# Patient Record
Sex: Male | Born: 1970 | Race: Black or African American | Hispanic: No | Marital: Single | Smoking: Never smoker
Health system: Southern US, Community
[De-identification: ages and names within clinical notes are randomized; demographics above are authoritative.]

## PROBLEM LIST (undated history)

## (undated) DIAGNOSIS — G893 Neoplasm related pain (acute) (chronic): Secondary | ICD-10-CM

## (undated) DIAGNOSIS — I10 Essential (primary) hypertension: Secondary | ICD-10-CM

## (undated) DIAGNOSIS — M5416 Radiculopathy, lumbar region: Secondary | ICD-10-CM

## (undated) DIAGNOSIS — G8929 Other chronic pain: Secondary | ICD-10-CM

## (undated) DIAGNOSIS — M549 Dorsalgia, unspecified: Secondary | ICD-10-CM

## (undated) HISTORY — PX: LEG SURGERY: SHX1003

---

## 2009-09-23 ENCOUNTER — Emergency Department (HOSPITAL_COMMUNITY): Admission: EM | Admit: 2009-09-23 | Discharge: 2009-09-23 | Payer: Self-pay | Admitting: Emergency Medicine

## 2009-10-24 ENCOUNTER — Emergency Department (HOSPITAL_COMMUNITY)
Admission: EM | Admit: 2009-10-24 | Discharge: 2009-10-24 | Payer: Self-pay | Source: Home / Self Care | Admitting: Emergency Medicine

## 2009-10-30 ENCOUNTER — Emergency Department (HOSPITAL_COMMUNITY): Admission: EM | Admit: 2009-10-30 | Discharge: 2009-10-30 | Payer: Self-pay | Admitting: Emergency Medicine

## 2009-11-22 ENCOUNTER — Emergency Department (HOSPITAL_COMMUNITY): Admission: EM | Admit: 2009-11-22 | Discharge: 2009-11-22 | Payer: Self-pay | Admitting: Emergency Medicine

## 2009-11-25 ENCOUNTER — Emergency Department (HOSPITAL_COMMUNITY): Admission: EM | Admit: 2009-11-25 | Discharge: 2009-11-25 | Payer: Self-pay | Admitting: Emergency Medicine

## 2009-12-07 ENCOUNTER — Ambulatory Visit: Payer: Self-pay | Admitting: Internal Medicine

## 2009-12-07 LAB — CONVERTED CEMR LAB
CO2: 28 meq/L (ref 19–32)
Calcium: 10.2 mg/dL (ref 8.4–10.5)
Chloride: 100 meq/L (ref 96–112)
Cholesterol: 157 mg/dL (ref 0–200)
Creatinine, Ser: 1.05 mg/dL (ref 0.40–1.50)
Glucose, Bld: 80 mg/dL (ref 70–99)
Total Bilirubin: 1.2 mg/dL (ref 0.3–1.2)
Total CHOL/HDL Ratio: 2.6
Triglycerides: 73 mg/dL (ref <150)
VLDL: 15 mg/dL (ref 0–40)

## 2009-12-25 ENCOUNTER — Emergency Department (HOSPITAL_COMMUNITY): Admission: EM | Admit: 2009-12-25 | Discharge: 2009-12-25 | Payer: Self-pay | Admitting: Emergency Medicine

## 2010-01-03 ENCOUNTER — Emergency Department (HOSPITAL_COMMUNITY): Admission: EM | Admit: 2010-01-03 | Discharge: 2010-01-03 | Payer: Self-pay | Admitting: Emergency Medicine

## 2010-01-05 ENCOUNTER — Emergency Department (HOSPITAL_COMMUNITY): Admission: EM | Admit: 2010-01-05 | Discharge: 2010-01-05 | Payer: Self-pay | Admitting: Emergency Medicine

## 2010-05-06 ENCOUNTER — Emergency Department (HOSPITAL_COMMUNITY): Payer: Self-pay

## 2010-05-06 ENCOUNTER — Emergency Department (HOSPITAL_COMMUNITY)
Admission: EM | Admit: 2010-05-06 | Discharge: 2010-05-06 | Disposition: A | Discharge status: Home / Self Care | Payer: Self-pay | Attending: Emergency Medicine | Admitting: Emergency Medicine

## 2010-05-06 DIAGNOSIS — S300XXA Contusion of lower back and pelvis, initial encounter: Secondary | ICD-10-CM | POA: Insufficient documentation

## 2010-05-06 DIAGNOSIS — S298XXA Other specified injuries of thorax, initial encounter: Secondary | ICD-10-CM | POA: Insufficient documentation

## 2010-05-06 DIAGNOSIS — R071 Chest pain on breathing: Secondary | ICD-10-CM | POA: Insufficient documentation

## 2010-05-06 DIAGNOSIS — I10 Essential (primary) hypertension: Secondary | ICD-10-CM | POA: Insufficient documentation

## 2010-05-06 DIAGNOSIS — G8929 Other chronic pain: Secondary | ICD-10-CM | POA: Insufficient documentation

## 2010-05-06 DIAGNOSIS — Z79899 Other long term (current) drug therapy: Secondary | ICD-10-CM | POA: Insufficient documentation

## 2010-05-06 DIAGNOSIS — IMO0001 Reserved for inherently not codable concepts without codable children: Secondary | ICD-10-CM | POA: Insufficient documentation

## 2010-05-06 DIAGNOSIS — R0602 Shortness of breath: Secondary | ICD-10-CM | POA: Insufficient documentation

## 2010-05-14 ENCOUNTER — Emergency Department (HOSPITAL_COMMUNITY)
Admission: EM | Admit: 2010-05-14 | Discharge: 2010-05-14 | Disposition: A | Discharge status: Home / Self Care | Payer: Self-pay | Attending: Emergency Medicine | Admitting: Emergency Medicine

## 2010-05-14 DIAGNOSIS — Z79899 Other long term (current) drug therapy: Secondary | ICD-10-CM | POA: Insufficient documentation

## 2010-05-14 DIAGNOSIS — G8929 Other chronic pain: Secondary | ICD-10-CM | POA: Insufficient documentation

## 2010-05-14 DIAGNOSIS — I10 Essential (primary) hypertension: Secondary | ICD-10-CM | POA: Insufficient documentation

## 2010-05-14 DIAGNOSIS — M549 Dorsalgia, unspecified: Secondary | ICD-10-CM | POA: Insufficient documentation

## 2010-05-14 DIAGNOSIS — S298XXA Other specified injuries of thorax, initial encounter: Secondary | ICD-10-CM | POA: Insufficient documentation

## 2010-05-14 DIAGNOSIS — R071 Chest pain on breathing: Secondary | ICD-10-CM | POA: Insufficient documentation

## 2010-05-24 LAB — POCT I-STAT, CHEM 8
Calcium, Ion: 1.07 mmol/L — ABNORMAL LOW (ref 1.12–1.32)
Chloride: 104 mEq/L (ref 96–112)
Creatinine, Ser: 1 mg/dL (ref 0.4–1.5)
Glucose, Bld: 90 mg/dL (ref 70–99)
HCT: 44 % (ref 39.0–52.0)
Hemoglobin: 15 g/dL (ref 13.0–17.0)
Potassium: 3.6 mEq/L (ref 3.5–5.1)

## 2010-05-26 LAB — POCT I-STAT, CHEM 8
BUN: 7 mg/dL (ref 6–23)
Calcium, Ion: 1.15 mmol/L (ref 1.12–1.32)
Chloride: 104 mEq/L (ref 96–112)
Glucose, Bld: 91 mg/dL (ref 70–99)
TCO2: 28 mmol/L (ref 0–100)

## 2010-05-29 ENCOUNTER — Emergency Department (HOSPITAL_COMMUNITY)
Admission: EM | Admit: 2010-05-29 | Discharge: 2010-05-29 | Disposition: A | Discharge status: Home / Self Care | Payer: Self-pay | Attending: Emergency Medicine | Admitting: Emergency Medicine

## 2010-05-29 DIAGNOSIS — I10 Essential (primary) hypertension: Secondary | ICD-10-CM | POA: Insufficient documentation

## 2010-05-29 DIAGNOSIS — M545 Low back pain, unspecified: Secondary | ICD-10-CM | POA: Insufficient documentation

## 2010-05-29 DIAGNOSIS — Z79899 Other long term (current) drug therapy: Secondary | ICD-10-CM | POA: Insufficient documentation

## 2010-05-29 DIAGNOSIS — G8929 Other chronic pain: Secondary | ICD-10-CM | POA: Insufficient documentation

## 2014-02-26 ENCOUNTER — Emergency Department (HOSPITAL_COMMUNITY)
Admission: EM | Admit: 2014-02-26 | Discharge: 2014-02-26 | Disposition: A | Discharge status: Home / Self Care | Payer: Self-pay | Attending: Emergency Medicine | Admitting: Emergency Medicine

## 2014-02-26 ENCOUNTER — Encounter (HOSPITAL_COMMUNITY): Payer: Self-pay | Admitting: Emergency Medicine

## 2014-02-26 DIAGNOSIS — M545 Low back pain, unspecified: Secondary | ICD-10-CM

## 2014-02-26 DIAGNOSIS — Z79899 Other long term (current) drug therapy: Secondary | ICD-10-CM | POA: Insufficient documentation

## 2014-02-26 DIAGNOSIS — M79605 Pain in left leg: Secondary | ICD-10-CM | POA: Insufficient documentation

## 2014-02-26 HISTORY — DX: Neoplasm related pain (acute) (chronic): G89.3

## 2014-02-26 MED ORDER — PREDNISONE 10 MG PO TABS: ORAL_TABLET | ORAL | Status: DC

## 2014-02-26 MED ORDER — OXYCODONE-ACETAMINOPHEN 5-325 MG PO TABS: 2.0000 | ORAL_TABLET | ORAL | Status: DC | PRN

## 2014-02-26 NOTE — ED Provider Notes (Signed)
CSN: [id number]     Arrival date & time 02/26/14  1049 History   First MD Initiated Contact with Patient 02/26/14 1136     Chief Complaint  Patient presents with  . Back Pain  . Leg Pain     (Consider location/radiation/quality/duration/timing/severity/associated sxs/prior Treatment) Patient is a 43 y.o. male presenting with back pain and leg pain. The history is provided by the patient. No language interpreter was used.  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  Pt reports he saw neurosurgeon who advised he did not need surgery. Pt reports one week ago he began having pain in low back  Past Medical History  Diagnosis Date  . Tumor associated pain    History reviewed. No pertinent past surgical history. History reviewed. No pertinent family history. History  Substance Use Topics  . Smoking status: Never Smoker   . Smokeless tobacco: Not on file  . Alcohol Use: No    Review of Systems  Musculoskeletal: Positive for back pain.  All other systems reviewed and are negative.     Allergies  Hydrocodone; Ibuprofen; and Iodine  Home Medications   Prior to Admission medications   Medication Sig Start Date End Date Taking? Authorizing Provider  amLODipine (NORVASC) 10 MG tablet Take 10 mg by mouth daily.   Yes Historical Provider, MD   BP 115/76 mmHg  Pulse 102  Temp(Src) 98.3 F (36.8 C) (Oral)  Resp 18  SpO2 97% Physical Exam  Constitutional: He appears well-developed and well-nourished.   HENT:  Head: Normocephalic.  Right Ear: External ear normal.  Left Ear: External ear normal.  Nose: Nose normal.  Mouth/Throat: Oropharynx is clear and moist.  Eyes: Conjunctivae are normal. Pupils are equal, round, and reactive to light.  Neck: Normal range of motion. Neck supple.  Cardiovascular: Normal rate and normal heart sounds.   Pulmonary/Chest: Effort normal.  Abdominal: Soft.  Musculoskeletal: Normal range of motion.  Neurological: He is alert.  Skin: Skin is warm.  Psychiatric: He has a normal mood and affect.  Nursing note and vitals reviewed.   ED Course  Procedures (including critical care time) Labs Review Labs Reviewed - No data to display  Imaging Review No results found.   EKG Interpretation None      MDM   Final diagnoses:  Midline low back pain without sciatica    Prednisone Percocet Schedule appoinment to see Dr. Rayburn MaBlackmon for evaluation    Elson AreasLeslie K Sofia, PA-C 02/26/14 1209  Lyanne CoKevin M Campos, MD 02/26/14 [phone]

## 2014-02-26 NOTE — ED Notes (Signed)
Pt c/o mid back pain from tumor on spine per pt; pt sts pain radiating down left leg

## 2014-03-05 ENCOUNTER — Encounter (HOSPITAL_COMMUNITY): Payer: Self-pay

## 2014-03-05 ENCOUNTER — Emergency Department (HOSPITAL_COMMUNITY)
Admission: EM | Admit: 2014-03-05 | Discharge: 2014-03-05 | Disposition: A | Discharge status: Home / Self Care | Payer: Medicaid - Out of State | Attending: Emergency Medicine | Admitting: Emergency Medicine

## 2014-03-05 DIAGNOSIS — M5442 Lumbago with sciatica, left side: Secondary | ICD-10-CM | POA: Diagnosis not present

## 2014-03-05 DIAGNOSIS — M25569 Pain in unspecified knee: Secondary | ICD-10-CM | POA: Insufficient documentation

## 2014-03-05 DIAGNOSIS — I10 Essential (primary) hypertension: Secondary | ICD-10-CM | POA: Insufficient documentation

## 2014-03-05 DIAGNOSIS — Z79899 Other long term (current) drug therapy: Secondary | ICD-10-CM | POA: Insufficient documentation

## 2014-03-05 DIAGNOSIS — G8929 Other chronic pain: Secondary | ICD-10-CM | POA: Insufficient documentation

## 2014-03-05 DIAGNOSIS — R102 Pelvic and perineal pain: Secondary | ICD-10-CM | POA: Diagnosis present

## 2014-03-05 HISTORY — DX: Essential (primary) hypertension: I10

## 2014-03-05 MED ORDER — HYDROMORPHONE HCL 1 MG/ML IJ SOLN
1.0000 mg | Freq: Once | INTRAMUSCULAR | Status: AC
  Administered 2014-03-05: 1 mg via INTRAMUSCULAR
  Filled 2014-03-05: qty 1

## 2014-03-05 MED ORDER — OXYCODONE-ACETAMINOPHEN 5-325 MG PO TABS
1.0000 | ORAL_TABLET | ORAL | Status: DC | PRN
Start: 2014-03-05 — End: 2014-04-04

## 2014-03-05 NOTE — Discharge Instructions (Signed)
Back Exercises Back exercises help treat and prevent back injuries. The goal of back exercises is to increase the strength of your abdominal and back muscles and the flexibility of your back. These exercises should be started when you no longer have back pain. Back exercises include:  Pelvic Tilt. Lie on your back with your knees bent. Tilt your pelvis until the lower part of your back is against the floor. Hold this position 5 to 10 sec and repeat 5 to 10 times.  Knee to Chest. Pull first 1 knee up against your chest and hold for 20 to 30 seconds, repeat this with the other knee, and then both knees. This may be done with the other leg straight or bent, whichever feels better.  Sit-Ups or Curl-Ups. Bend your knees 90 degrees. Start with tilting your pelvis, and do a partial, slow sit-up, lifting your trunk only 30 to 45 degrees off the floor. Take at least 2 to 3 seconds for each sit-up. Do not do sit-ups with your knees out straight. If partial sit-ups are difficult, simply do the above but with only tightening your abdominal muscles and holding it as directed.  Hip-Lift. Lie on your back with your knees flexed 90 degrees. Push down with your feet and shoulders as you raise your hips a couple inches off the floor; hold for 10 seconds, repeat 5 to 10 times.  Back arches. Lie on your stomach, propping yourself up on bent elbows. Slowly press on your hands, causing an arch in your low back. Repeat 3 to 5 times. Any initial stiffness and discomfort should lessen with repetition over time.  Shoulder-Lifts. Lie face down with arms beside your body. Keep hips and torso pressed to floor as you slowly lift your head and shoulders off the floor. Do not overdo your exercises, especially in the beginning. Exercises may cause you some mild back discomfort which lasts for a few minutes; however, if the pain is more severe, or lasts for more than 15 minutes, do not continue exercises until you see your caregiver.  Improvement with exercise therapy for back problems is slow.  See your caregivers for assistance with developing a proper back exercise program. Document Released: 04/05/2004 Document Revised: 05/21/2011 Document Reviewed: 12/28/2010 [facility] Patient Information 2015 [place]. This information is not intended to replace advice given to you by your health care provider. Make sure you discuss any questions you have with your health care provider. Radicular Pain Radicular pain in either the arm or leg is usually from a bulging or herniated disk in the spine. A piece of the herniated disk may press against the nerves as the nerves exit the spine. This causes pain which is felt at the tips of the nerves down the arm or leg. Other causes of radicular pain may include:  Fractures.  Heart disease.  Cancer.  An abnormal and usually degenerative state of the nervous system or nerves (neuropathy). Diagnosis may require CT or MRI scanning to determine the primary cause.  Nerves that start at the neck (nerve roots) may cause radicular pain in the outer shoulder and arm. It can spread down to the thumb and fingers. The symptoms vary depending on which nerve root has been affected. In most cases radicular pain improves with conservative treatment. Neck problems may require physical therapy, a neck collar, or cervical traction. Treatment may take many weeks, and surgery may be considered if the symptoms do not improve.  Conservative treatment is also recommended for sciatica. Sciatica  causes pain to radiate from the lower back or buttock area down the leg into the foot. Often there is a history of back problems. Most patients with sciatica are better after 2 to 4 weeks of rest and other supportive care. Short term bed rest can reduce the disk pressure considerably. Sitting, however, is not a good position since this increases the pressure on the disk. You should avoid bending, lifting, and all other  activities which make the problem worse. Traction can be used in severe cases. Surgery is usually reserved for patients who do not improve within the first months of treatment. Only take over-the-counter or prescription medicines for pain, discomfort, or fever as directed by your caregiver. Narcotics and muscle relaxants may help by relieving more severe pain and spasm and by providing mild sedation. Cold or massage can give significant relief. Spinal manipulation is not recommended. It can increase the degree of disc protrusion. Epidural steroid injections are often effective treatment for radicular pain. These injections deliver medicine to the spinal nerve in the space between the protective covering of the spinal cord and back bones (vertebrae). Your caregiver can give you more information about steroid injections. These injections are most effective when given within two weeks of the onset of pain.  You should see your caregiver for follow up care as recommended. A program for neck and back injury rehabilitation with stretching and strengthening exercises is an important part of management.  SEEK IMMEDIATE MEDICAL CARE IF:  You develop increased pain, weakness, or numbness in your arm or leg.  You develop difficulty with bladder or bowel control.  You develop abdominal pain. Document Released: 04/05/2004 Document Revised: 05/21/2011 Document Reviewed: 06/21/2008 [facility] Patient Information 2015 [place], [place]. This information is not intended to replace advice given to you by your health care provider. Make sure you discuss any questions you have with your health care provider.   Emergency Department Resource Guide 1) Find a Doctor and Pay Out of Pocket Although you won't have to find out who is covered by your insurance plan, it is a good idea to ask around and get recommendations. You will then need to call the office and see if the doctor you have chosen will accept you as a new patient and  what types of options they offer for patients who are self-pay. Some doctors offer discounts or will set up payment plans for their patients who do not have insurance, but you will need to ask so you aren't surprised when you get to your appointment.  2) Contact Your Local Health Department Not all health departments have doctors that can see patients for sick visits, but many do, so it is worth a call to see if yours does. If you don't know where your local health department is, you can check in your phone book. The CDC also has a tool to help you locate your state's health department, and many state websites also have listings of all of their local health departments.  3) Find a Walk-in Clinic If your illness is not likely to be very severe or complicated, you may want to try a walk in clinic. These are popping up all over the country in pharmacies, drugstores, and shopping centers. They're usually staffed by nurse practitioners or physician assistants that have been trained to treat common illnesses and complaints. They're usually fairly quick and inexpensive. However, if you have serious medical issues or chronic medical problems, these are probably not your best option.  No  Primary Care Doctor: - Call Health Connect at  [phone] - they can help you locate a primary care doctor that  accepts your insurance, provides certain services, etc. - Physician Referral Service- [phone]  Chronic Pain Problems: Organization         Address  Phone   Notes  [facility]  [phone] Patients need to be referred by their primary care doctor.   Medication Assistance: Organization         Address  Phone   Notes  [facility] Medication [facility] [address] [place], [place] [address] [phone]) [phone] --Must be a resident of [facility] [place] -- Must have NO insurance coverage whatsoever (no Medicaid/ Medicare, etc.) -- The pt. MUST have a primary care doctor  that directs their care regularly and follows them in the community   MedAssist  [phone]   [employer]  [phone]) [phone]    Agencies that provide inexpensive medical care: Organization         Address  Phone   Notes  [facility]  [phone](336) [phone]   [facility]    [phone]) [phone]   [facility] [address] [place], [place] [address] [phone]) [phone]   [facility] [address], [place] [phone](336) [phone]   [facility]anned [facility]    [phone](336) [phone]   [facility]    [phone]) [phone]   Community [facility] and [facility]  [address], [place] Phone:  [phone], Fax:  [phone] Hours of Operation:  9 am - 6 pm, M-F.  Also accepts Medicaid/Medicare and self-pay.  [facility]  [address], [place] Phone: [phone](336) [phone], Fax: [phone]) [phone]. Hours of Operation:  8:30 am - 5:30 pm, M-F.  Also accepts Medicaid and self-pay.  [facility] [place] [address], [place] Phone: [phone]) [phone]   [facility] [address], [place], [place] [phone], Ext. [phone] Mondays & Thursdays: 7-9 AM.  First 15 patients are seen on a first come, first serve basis.    Medicaid-accepting [facility] Providers:  Organization         Address  Phone   Notes  [facility] [address], [place] [phone] Also accepts self-pay patients.  [facility] [address], [place]  [phone]) [phone]   [facility] [address], [place] [phone]) [phone]   [facility] [address] [place] [address], [place] [phone]) [phone]   Renaye RakersVeita Bland [address], [place]   [phone] Only accepts [place] Access [place] patients after they have their name applied to their card.   Self-Pay (no insurance) in [facility] [place]:  Organization          Address  Phone   Notes  Sickle Cell Patients, [facility] [address], [place] [phone]) [phone]   [facility] [address], [place] [phone]) [phone]   [facility] [place]  [address], [place] [phone]) [phone]   [facility]/Dr. Osei-Bonsu  [address] [place] [address], [place] or [phone], [place] [phone]) [phone] Phone number for both [place] and [place] locations is the same.  [facility] [address], [place] [phone]  [facility] [address] [place] or [address] [phone]   [facility] [place], [place] [phone], phone; [phone], fax Sees patients 1st and 3rd Saturday of every month.  Must not qualify for public or private insurance (i.e. Medicaid, Medicare, [place] Health Choice, Veterans' Benefits)  Household income should be no more than 200% of the poverty level The clinic cannot treat you if you are pregnant or think you are pregnant  Sexually transmitted diseases are not treated at the clinic.    Dental Care: Organization         Address  Phone  Notes  [facility] [place] [phone] Accepts children up to age 85 who are enrolled in [place] or [place]; pregnant women with a Medicaid card; and children who have applied for Medicaid or [place] Health Choice, but were declined, whose parents can pay a reduced fee at time of service.  [facility]  [address], [place] [phone] Accepts children up to age 87 who are enrolled in [place] or [place]; pregnant women with a Medicaid card; and children who have applied for Medicaid or  Health Choice, but were declined, whose parents can pay a reduced fee at time of  service.  [facility] Adult [facility] Access PROGRAM  [place] [phone] Patients are seen by appointment only. Walk-ins are not accepted. [employer] will see patients 56 years of age and older. Monday - Tuesday (8am-5pm) Most Wednesdays (8:30-5pm) $30 per visit, cash only  [facility] Access PRO[facility]  [address], [facility] [phone] Patients are seen by appointment only. Walk-ins are not accepted. [place] will see patients 45 years of age and older. One Wednesday Evening (Monthly: Volunteer Based).  $30 per visit, cash only  [place]  [phone] for adults; Children under age 62, call Graduate Pediatric Dentistry at [phone]. Children aged 14-14, please call [phone] to request a pediatric application.  Dental services are provided in all areas of dental care including fillings, crowns and bridges, complete and partial dentures, implants, gum treatment, root canals, and extractions. Preventive care is also provided. Treatment is provided to both adults and children. Patients are selected via a lottery and there is often a waiting list.   [facility] [address] [place]  [phone] [email or web address]   [facility] [address] [place], [place] [phone], Ext. [phone] Second and Fourth Thursday of each month, opens at 6:30 AM; Clinic ends at 9 AM.  Patients are seen on a first-come first-served basis, and a limited number are seen during each clinic.   [facility]  [address] Hillard Danker [place], [place] [phone]   Eligibility Requirements You must have lived in [place], [place], or [place] counties for at least the last three months.   You cannot be eligible for state or federal sponsored [employer], including [employer], [place], or [employer].   You generally cannot be eligible for healthcare insurance through your employer.     How to apply: Eligibility screenings are held every Tuesday and Wednesday afternoon from 1:00 pm until 4:00 pm. You do not need an appointment for the interview!  [facility] [address], [place], [place]  [place]  [place] Department  [place]  [phone]    Behavioral Health Resources in the Community: Intensive Outpatient Programs Organization         Address  Phone  Notes  [place] [place], [place] [phone]   [facility] [address] [place], [place]   ADS: Alcohol & Drug Svcs [address] [place], [place]   [place] [address]  [place], [place] or [phone]   Substance Abuse Resources Organization         Address  Phone  Notes  Alcohol and Drug Services  [phone]   [place]  [phone]   The [place]   Chinita Pester  [phone]   Residential & Outpatient Substance Abuse Program  [phone]   Psychological Services Organization         Address  Phone  Notes  [facility] [place]  [place]  [phone]   [place] [address], [place] or [phone]    Mobile Crisis Teams Organization         Address  Phone  Notes  [facility]rapeutic Alternatives, Mobile Crisis Care Unit  [phone]   [facility]ive Psychotherapeutic Services  [address] [place], [place]   [place] Levels [address] [place] [phone]    Self-Help/Support Groups Organization         Address  Phone             Notes  [place]. of [place] - variety of support groups  [place] Call for more information  Narcotics Anonymous (NA), Caring Services [address], [employer] [place]  2 meetings at this location   Special educational needs teacher         Address  Phone  Notes  [facility] Residential Treatment Ken Caryl,    [place]  [phone]   [facility]  [address], [place] [id number], [place], [place]   Aldine [place], [place] [phone] Admissions: 8am-3pm M-F  Incentives Substance [place] [address]    [place], [place] [phone]   [facility] [address] [place], [place], [place]   [facility] [address]  [place], [place]   Insight Programs - Intensive Outpatient [address] Kristeen Mans 1, [place], [place]   [facility] ([place].,  [place], [place] [phone] or [phone]   [facility]idential Treatment Services (RTS) [address], [place], [place] Accepts Medicaid  [facility] [place] [address],  [place] [phone] Substance Abuse/Addiction Treatment   [facility] Organization         Address  Phone  Notes  [facility]  [phone]   Domenic Schwab, PhD [address] Arlis Porta [place], [place]   [phone] or [phone]   [place], [place] [phone]   [place] [address] [place], [place] [phone] Insurance/Medicaid/sponsorship through [employer] and Families [address] [id number]  [place], [place] [phone] [place], [place] [phone]    Dr. Adele Schilder  [phone]   [facility] [place] [facility] Dept. 1) [address], [place] 2) [place] 3)  [place] [address], [place] [phone]  [phone]   Center [phone] or [phone] (After Hours)

## 2014-03-05 NOTE — ED Notes (Signed)
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 He reports pain in his pelvic region, left hip, and knee. He also reports muscle spasms. Pt ambulatory to room D32.

## 2014-03-05 NOTE — ED Provider Notes (Signed)
CSN: [id number]     Arrival date & time 03/05/14  9562 History   First MD Initiated Contact with Patient 03/05/14 1007     Chief Complaint  Patient presents with  . Pelvic Pain  . Knee Pain   Garrett Cole is a 43 y.o. 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 Patient reports that he has chronic pain in this distribution however today it is flaring up. The patient reports his pain has been worse since last night and he rates his pain at a 10 out of 10. Contrary to the nursing note he has no pain in his pelvic region. The patient denies changes to his type of pain only that it has worsened today. The patient has tried heating pads without relief today. The patient was referred to Dr. Magnus Ivan at his last visit in the ED on 02/26/2014 and he has been unable to get an appointment. The patient has out-of-state insurance and does not have a primary care provider currently. The patient does not regularly take pain medicine for his pain. The patient denies fevers, chills, abdominal pain, nausea, vomiting, numbness, loss of bowel control, loss of bladder control, loss of coordination, headaches, changes to his vision, chest pain, shortness of breath, palpitations. Patient denies history of cancer. The patient denies history of IV drug use.  (Consider location/radiation/quality/duration/timing/severity/associated sxs/prior Treatment) HPI  Past Medical History  Diagnosis Date  . Tumor associated pain   . Hypertension    Past Surgical History  Procedure Laterality Date  . Leg surgery     History reviewed. No pertinent family history. History  Substance Use Topics  . Smoking status: Never Smoker   . Smokeless tobacco: Not on file  . Alcohol Use: No    Review of Systems  Constitutional: Negative for fever and chills.  HENT: Negative for congestion, ear  pain, sore throat and trouble swallowing.   Eyes: Negative for pain and visual disturbance.  Respiratory: Negative for cough, shortness of breath and wheezing.   Cardiovascular: Negative for chest pain, palpitations and leg swelling.  Gastrointestinal: Negative for nausea, vomiting, abdominal pain, diarrhea and blood in stool.  Genitourinary: Negative for dysuria, urgency, frequency and hematuria.  Musculoskeletal: Positive for back pain. Negative for joint swelling, neck pain and neck stiffness.  Skin: Negative for rash and wound.  Neurological: Negative for dizziness, numbness and headaches.  All other systems reviewed and are negative.     Allergies  Hydrocodone; Ibuprofen; and Iodine  Home Medications   Prior to Admission medications   Medication Sig Start Date End Date Taking? Authorizing Provider  amLODipine (NORVASC) 10 MG tablet Take 10 mg by mouth daily.   Yes Historical Provider, MD  oxyCODONE-acetaminophen (PERCOCET/ROXICET) 5-325 MG per tablet Take 1 tablet by mouth every 4 (four) hours as needed for severe pain. May take 2 tablets PO q 6 hours for severe pain - Do not take with Tylenol as this tablet already contains tylenol 03/05/14   Einar Gip Helaman Mecca, PA-C  predniSONE (DELTASONE) 10 MG tablet 6,5,4,3,2,1 taper Patient not taking: Reported on 03/05/2014 02/26/14   Elson Areas, PA-C   BP 122/91 mmHg  Pulse 94  Temp(Src) 98.9 F (37.2 C) (Oral)  Resp 16  Ht 6' (1.829 m)  Wt 160 lb (72.576 kg)  BMI 21.70 kg/m2  SpO2 99% Physical Exam  Constitutional: He is  oriented to person, place, and time. He appears well-developed and well-nourished. No distress.  HENT:  Head: Normocephalic and atraumatic.  Mouth/Throat: Oropharynx is clear and moist. No oropharyngeal exudate.  Eyes: Conjunctivae are normal. Pupils are equal, round, and reactive to light. Right eye exhibits no discharge. Left eye exhibits no discharge.  Neck: Normal range of motion. Neck supple.   Cardiovascular: Normal rate, regular rhythm, normal heart sounds and intact distal pulses.  Exam reveals no gallop and no friction rub.   No murmur heard. HR 88. Bilateral radial pulses are intact. Bilateral posterior tibialis pulses are intact.  Pulmonary/Chest: Effort normal and breath sounds normal. No respiratory distress. He has no wheezes. He has no rales.  Abdominal: Soft. He exhibits no distension. There is no tenderness.  Musculoskeletal: Normal range of motion. He exhibits no edema or tenderness.  Patient's strength is 5 out of 5 in his bilateral upper and lower extremities. Patient is spontaneously moving all extremities in a coordinated fashion exhibiting good strength. Patient is able to ambulate in the room without difficulty or assistance. Patient's bilateral patellar DTRs are intact. There is no evidence of crepitus or step-offs. There is no back edema. There is no lower extremity edema.  Lymphadenopathy:    He has no cervical adenopathy.  Neurological: He is alert and oriented to person, place, and time. He has normal reflexes. He displays normal reflexes. No cranial nerve deficit. Coordination normal.  Cranial nerves II through XII are intact bilaterally. Patient's bilateral patellar DTRs are intact. Patient has good sensation is bilateral lower extremities.  Skin: Skin is warm and dry. No rash noted. He is not diaphoretic. No erythema. No pallor.  Psychiatric: He has a normal mood and affect. His behavior is normal.  Nursing note and vitals reviewed.   ED Course  Procedures (including critical care time) Labs Review Labs Reviewed - No data to display  Imaging Review No results found.   EKG Interpretation None      Filed Vitals:   03/05/14 0919 03/05/14 1126  BP: 131/77 122/91  Pulse: 100 94  Temp: 98.9 F (37.2 C)   TempSrc: Oral   Resp: 16   Height: 6' (1.829 m)   Weight: 160 lb (72.576 kg)   SpO2: 100% 99%     MDM   Meds given in ED:  Medications   HYDROmorphone (DILAUDID) injection 1 mg (1 mg Intramuscular Given 03/05/14 1052)    Discharge Medication List as of 03/05/2014 10:52 AM      Final diagnoses:  Left-sided low back pain with left-sided sciatica   The patient presented to the ED complaining of worsening low back pain that radiates into his leg. 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 Patient reports his pain has not changed, but it is just worse today. Contrary to the nursing note the patient does not have pelvic pain. The patient denies loss of bowel or bladder control. Patient denies saddle anesthesia. Patient denies numbness. Patient is able to ambulate without difficulty or assistance. The patient is neurologically intact. He  was provided dilaudid 1 mg in the ED with relief. Patient was provided prescription for Percocet for breakthrough pain at home. Advised patient use caution while taking Percocet as it can make him drowsy. I advised patient not to drive take Percocet. I  advised patient that he needs follow-up with Dr. Magnus IvanBlackman. Advised patient to return to the emergency department with new or worsening symptoms or new concerns. Patient  verbalized understanding and agreement pain.  This patient was discussed with Dr. Micheline Mazeocherty who agrees with assessment and plan.     Lawana ChambersWilliam Duncan Charmika Macdonnell, PA-C 03/05/14 1727  Toy CookeyMegan Docherty, MD 03/08/14 1901

## 2014-04-04 ENCOUNTER — Encounter (HOSPITAL_COMMUNITY): Payer: Self-pay | Admitting: *Deleted

## 2014-04-04 ENCOUNTER — Emergency Department (HOSPITAL_COMMUNITY)
Admission: EM | Admit: 2014-04-04 | Discharge: 2014-04-04 | Disposition: A | Discharge status: Home / Self Care | Payer: Medicaid - Out of State | Attending: Emergency Medicine | Admitting: Emergency Medicine

## 2014-04-04 DIAGNOSIS — M549 Dorsalgia, unspecified: Secondary | ICD-10-CM

## 2014-04-04 DIAGNOSIS — Z79899 Other long term (current) drug therapy: Secondary | ICD-10-CM | POA: Insufficient documentation

## 2014-04-04 DIAGNOSIS — M25551 Pain in right hip: Secondary | ICD-10-CM | POA: Insufficient documentation

## 2014-04-04 DIAGNOSIS — Z8669 Personal history of other diseases of the nervous system and sense organs: Secondary | ICD-10-CM | POA: Insufficient documentation

## 2014-04-04 DIAGNOSIS — M545 Low back pain: Secondary | ICD-10-CM | POA: Insufficient documentation

## 2014-04-04 DIAGNOSIS — I10 Essential (primary) hypertension: Secondary | ICD-10-CM | POA: Insufficient documentation

## 2014-04-04 MED ORDER — OXYCODONE-ACETAMINOPHEN 5-325 MG PO TABS
2.0000 | ORAL_TABLET | Freq: Once | ORAL | Status: AC
  Administered 2014-04-04: 2 via ORAL
  Filled 2014-04-04: qty 2

## 2014-04-04 MED ORDER — OXYCODONE-ACETAMINOPHEN 5-325 MG PO TABS
1.0000 | ORAL_TABLET | ORAL | Status: DC | PRN
Start: 2014-04-04 — End: 2014-04-30

## 2014-04-04 NOTE — Discharge Instructions (Signed)
Take the prescribed medication as directed. Follow-up with [facility]-- [facility]. Return to the ED for new or worsening symptoms.

## 2014-04-04 NOTE — ED Provider Notes (Signed)
CSN: [id number]     Arrival date & time 04/04/14  1640 History  This chart was scribed for non-physician practitioner, Sharilyn Sites, PA-C working with Toy Cookey, MD by Greggory Stallion, ED scribe. This patient was seen in room TR05C/TR05C and the patient's care was started at 5:06 PM.    Chief Complaint  Patient presents with  . Back Pain  . Hip Pain   The history is provided by the patient. No language interpreter was used.    HPI Comments: Garrett Cole is a 44 y.o. male with history of L5 tumor since 2011 who presents to the Emergency Department complaining of sharp lower back pain and right hip pain that started one week ago. States he has always had some form of pain in his back, but now pain is spreading to his right hip which is new. States he does have some radiation of sharp, searing pains into his right anterior and posterior thigh, but denies numbness, paresthesias, or weakness. He has remained ambulatory without difficulty. No loss of bowel or bladder control. Patient states in [place] he was evaluated by a neurosurgeon and told that he did not need surgery. He has not had any new imaging or follow-up since 2011. He was referred to an orthopedist here in [place], but they would not accept his [place].  States he is currently in [place] taking care of his mother who is ill with Alzheimer's. He is in [place] from open-ended amount of time.  No hx of cancer or IVDU.  No fever, chills, sweats.  VSS on arrival.  Past Medical History  Diagnosis Date  . Tumor associated pain   . Hypertension    Past Surgical History  Procedure Laterality Date  . Leg surgery     History reviewed. No pertinent family history. History  Substance Use Topics  . Smoking status: Never Smoker   . Smokeless tobacco: Not on file  . Alcohol Use: No    Review of Systems  Genitourinary: Negative for difficulty urinating.       Negative for bowel or bladder  incontinence.  Musculoskeletal: Positive for back pain and arthralgias.  Neurological: Negative for numbness.  All other systems reviewed and are negative.  Allergies  Hydrocodone; Ibuprofen; and Iodine  Home Medications   Prior to Admission medications   Medication Sig Start Date End Date Taking? Authorizing Provider  amLODipine (NORVASC) 10 MG tablet Take 10 mg by mouth daily.    Historical Provider, MD  oxyCODONE-acetaminophen (PERCOCET/ROXICET) 5-325 MG per tablet Take 1 tablet by mouth every 4 (four) hours as needed for severe pain. May take 2 tablets PO q 6 hours for severe pain - Do not take with Tylenol as this tablet already contains tylenol 03/05/14   Einar Gip Dansie, PA-C  predniSONE (DELTASONE) 10 MG tablet 6,5,4,3,2,1 taper Patient not taking: Reported on 03/05/2014 02/26/14   Lonia Skinner Sofia, PA-C   BP 134/100 mmHg  Pulse 102  Temp(Src) 98.9 F (37.2 C) (Oral)  Resp 16  Ht 6' (1.829 m)  Wt 165 lb (74.844 kg)  BMI 22.37 kg/m2  SpO2 97%   Physical Exam  Constitutional: He is oriented to person, place, and time. He appears well-developed and well-nourished.  HENT:  Head: Normocephalic and atraumatic.  Mouth/Throat: Oropharynx is clear and moist.  Eyes: Conjunctivae and EOM are normal. Pupils are equal, round, and reactive to light.  Neck: Normal range of motion.  Cardiovascular: Normal rate, regular rhythm and normal heart  sounds.   Pulmonary/Chest: Effort normal and breath sounds normal. No respiratory distress. He has no wheezes.  Musculoskeletal: Normal range of motion.       Lumbar back: He exhibits tenderness, bony tenderness and pain.  Diffuse pain of lumbar spine with some extension to lateral and anterior right hip. Patient endorses shooting pains down his anterior posterior right thigh. No bony deformities. Normal strength and sensation of bilateral lower extremities. No saddle or extremity numbness or paresthesias. Ambulatory without difficulty   Neurological: He is alert and oriented to person, place, and time.  Skin: Skin is warm and dry.  Psychiatric: He has a normal mood and affect.  Nursing note and vitals reviewed.   ED Course  Procedures (including critical care time)  DIAGNOSTIC STUDIES: Oxygen Saturation is 97% on RA, normal by my interpretation.    COORDINATION OF CARE: 5:08 PM-Discussed treatment plan which includes pain medication with pt at bedside and pt agreed to plan.   5:39 PM-Spoke to Dr. Micheline Mazeocherty about possibly doing MRI on pt. She advised neurosurgery consult.   Labs Review Labs Reviewed - No data to display  Imaging Review No results found.   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  On exam, patient has no focal neurologic deficits or red flag symptoms to suggest cauda equina or other neurologic compromise. I have reviewed his MRIs from 2011, his tumor was of unknown significance at that time. Given the patient has had no follow-up or new imaging since this time case was discussed with neurosurgery, Dr. Yetta BarreJones-- given that patient remains neurologically intact on exam, he does not feel the patient needs emergent MRI at this time. He does recommend pain control and for patient to reestablish with new neurosurgery office. Patient was given referral to [facility], but was advised that there may be a possibility that they will not accept his insurance. If not, he may need to seek medical attention from neurosurgery office in [place].  Rx percocet for home.  Discussed plan with patient, he/she acknowledged understanding and agreed with plan of care.  Return precautions given for new or worsening symptoms.  I personally performed the services described in this documentation, which was scribed in my presence. The recorded information has been reviewed and is accurate.  Garlon HatchetLisa  M Hero Mccathern, PA-C 04/04/14 1835  Toy CookeyMegan Docherty, MD 04/05/14 [phone]

## 2014-04-04 NOTE — ED Notes (Signed)
Declined W/C at D/C and was escorted to lobby by RN. 

## 2014-04-04 NOTE — ED Notes (Signed)
Pt c/o right hip and lower back pain. Pt states he has a tumor on his lower spine. Pt believes the tumor has spread to his right hip because of pain that started 3-4 days ago. Pt denies any recent falls, injury or trauma to back and right hip. Pt ambulatory to triage with steady gait.

## 2014-04-19 ENCOUNTER — Emergency Department (HOSPITAL_COMMUNITY)
Admission: EM | Admit: 2014-04-19 | Discharge: 2014-04-20 | Disposition: A | Discharge status: Home / Self Care | Payer: Self-pay | Attending: Emergency Medicine | Admitting: Emergency Medicine

## 2014-04-19 ENCOUNTER — Encounter (HOSPITAL_COMMUNITY): Payer: Self-pay | Admitting: Emergency Medicine

## 2014-04-19 ENCOUNTER — Emergency Department (HOSPITAL_COMMUNITY): Payer: Medicaid - Out of State

## 2014-04-19 DIAGNOSIS — I10 Essential (primary) hypertension: Secondary | ICD-10-CM | POA: Insufficient documentation

## 2014-04-19 DIAGNOSIS — M545 Low back pain, unspecified: Secondary | ICD-10-CM

## 2014-04-19 DIAGNOSIS — G8929 Other chronic pain: Secondary | ICD-10-CM | POA: Insufficient documentation

## 2014-04-19 DIAGNOSIS — Z79899 Other long term (current) drug therapy: Secondary | ICD-10-CM | POA: Insufficient documentation

## 2014-04-19 DIAGNOSIS — M5416 Radiculopathy, lumbar region: Secondary | ICD-10-CM | POA: Insufficient documentation

## 2014-04-19 DIAGNOSIS — R32 Unspecified urinary incontinence: Secondary | ICD-10-CM | POA: Insufficient documentation

## 2014-04-19 LAB — BASIC METABOLIC PANEL
Anion gap: 7 (ref 5–15)
BUN: 11 mg/dL (ref 6–23)
CALCIUM: 8.5 mg/dL (ref 8.4–10.5)
CO2: 26 mmol/L (ref 19–32)
CREATININE: 1.28 mg/dL (ref 0.50–1.35)
Chloride: 102 mmol/L (ref 96–112)
GFR calc non Af Amer: 66 mL/min — ABNORMAL LOW (ref >90)
GFR, EST AFRICAN AMERICAN: 77 mL/min — AB (ref >90)
GLUCOSE: 96 mg/dL (ref 70–99)
POTASSIUM: 3.2 mmol/L — AB (ref 3.5–5.1)
SODIUM: 135 mmol/L (ref 135–145)

## 2014-04-19 LAB — CBC WITH DIFFERENTIAL/PLATELET
BASOS ABS: 0.1 10*3/uL (ref 0.0–0.1)
BASOS PCT: 1 % (ref 0–1)
Eosinophils Absolute: 0.2 10*3/uL (ref 0.0–0.7)
Eosinophils Relative: 3 % (ref 0–5)
HCT: 39.5 % (ref 39.0–52.0)
HEMOGLOBIN: 13.1 g/dL (ref 13.0–17.0)
LYMPHS PCT: 28 % (ref 12–46)
Lymphs Abs: 1.7 10*3/uL (ref 0.7–4.0)
MCH: 28.7 pg (ref 26.0–34.0)
MCHC: 33.2 g/dL (ref 30.0–36.0)
MCV: 86.6 fL (ref 78.0–100.0)
MONOS PCT: 9 % (ref 3–12)
Monocytes Absolute: 0.5 10*3/uL (ref 0.1–1.0)
NEUTROS PCT: 59 % (ref 43–77)
Neutro Abs: 3.7 10*3/uL (ref 1.7–7.7)
PLATELETS: 222 10*3/uL (ref 150–400)
RBC: 4.56 MIL/uL (ref 4.22–5.81)
RDW: 15.1 % (ref 11.5–15.5)
WBC: 6.2 10*3/uL (ref 4.0–10.5)

## 2014-04-19 MED ORDER — MORPHINE SULFATE 4 MG/ML IJ SOLN
4.0000 mg | Freq: Once | INTRAMUSCULAR | Status: AC
  Administered 2014-04-19: 4 mg via INTRAVENOUS
  Filled 2014-04-19: qty 1

## 2014-04-19 MED ORDER — POTASSIUM CHLORIDE CRYS ER 20 MEQ PO TBCR
40.0000 meq | EXTENDED_RELEASE_TABLET | Freq: Once | ORAL | Status: AC
  Administered 2014-04-20: 40 meq via ORAL
  Filled 2014-04-19: qty 2

## 2014-04-19 NOTE — ED Notes (Signed)
Bladder Scan Volume 82ml

## 2014-04-19 NOTE — Discharge Instructions (Signed)
Read the information below.  You may return to the Emergency Department at any time for worsening condition or any new symptoms that concern you. If you develop fevers, loss of control of bowel or bladder, weakness or numbness in your legs, or are unable to walk, return to the ER for a recheck.    Chronic Back Pain  When back pain lasts longer than 3 months, it is called chronic back pain.People with chronic back pain often go through certain periods that are more intense (flare-ups).  CAUSES Chronic back pain can be caused by wear and tear (degeneration) on different structures in your back. These structures include:  The bones of your spine (vertebrae) and the joints surrounding your spinal cord and nerve roots (facets).  The strong, fibrous tissues that connect your vertebrae (ligaments). Degeneration of these structures may result in pressure on your nerves. This can lead to constant pain. HOME CARE INSTRUCTIONS  Avoid bending, heavy lifting, prolonged sitting, and activities which make the problem worse.  Take brief periods of rest throughout the day to reduce your pain. Lying down or standing usually is better than sitting while you are resting.  Take over-the-counter or prescription medicines only as directed by your caregiver. SEEK IMMEDIATE MEDICAL CARE IF:   You have weakness or numbness in one of your legs or feet.  You have trouble controlling your bladder or bowels.  You have nausea, vomiting, abdominal pain, shortness of breath, or fainting. Document Released: 04/05/2004 Document Revised: 05/21/2011 Document Reviewed: 02/10/2011 [facility] Patient Information 2015 [employer] [place]. This information is not intended to replace advice given to you by your health care provider. Make sure you discuss any questions you have with your health care provider.  Radicular Pain Radicular pain in either the arm or leg is usually from a bulging or herniated disk in the spine. A piece of  the herniated disk may press against the nerves as the nerves exit the spine. This causes pain which is felt at the tips of the nerves down the arm or leg. Other causes of radicular pain may include:  Fractures.  Heart disease.  Cancer.  An abnormal and usually degenerative state of the nervous system or nerves (neuropathy). Diagnosis may require CT or MRI scanning to determine the primary cause.  Nerves that start at the neck (nerve roots) may cause radicular pain in the outer shoulder and arm. It can spread down to the thumb and fingers. The symptoms vary depending on which nerve root has been affected. In most cases radicular pain improves with conservative treatment. Neck problems may require physical therapy, a neck collar, or cervical traction. Treatment may take many weeks, and surgery may be considered if the symptoms do not improve.  Conservative treatment is also recommended for sciatica. Sciatica causes pain to radiate from the lower back or buttock area down the leg into the foot. Often there is a history of back problems. Most patients with sciatica are better after 2 to 4 weeks of rest and other supportive care. Short term bed rest can reduce the disk pressure considerably. Sitting, however, is not a good position since this increases the pressure on the disk. You should avoid bending, lifting, and all other activities which make the problem worse. Traction can be used in severe cases. Surgery is usually reserved for patients who do not improve within the first months of treatment. Only take over-the-counter or prescription medicines for pain, discomfort, or fever as directed by your caregiver. Narcotics and muscle  relaxants may help by relieving more severe pain and spasm and by providing mild sedation. Cold or massage can give significant relief. Spinal manipulation is not recommended. It can increase the degree of disc protrusion. Epidural steroid injections are often effective treatment  for radicular pain. These injections deliver medicine to the spinal nerve in the space between the protective covering of the spinal cord and back bones (vertebrae). Your caregiver can give you more information about steroid injections. These injections are most effective when given within two weeks of the onset of pain.  You should see your caregiver for follow up care as recommended. A program for neck and back injury rehabilitation with stretching and strengthening exercises is an important part of management.  SEEK IMMEDIATE MEDICAL CARE IF:  You develop increased pain, weakness, or numbness in your arm or leg.  You develop difficulty with bladder or bowel control.  You develop abdominal pain. Document Released: 04/05/2004 Document Revised: 05/21/2011 Document Reviewed: 06/21/2008 [facility] Patient Information 2015 [place], [place]. This information is not intended to replace advice given to you by your health care provider. Make sure you discuss any questions you have with your health care provider.

## 2014-04-19 NOTE — ED Notes (Signed)
Pt presents with chronic back pain that radiates to bilateral lower legs, denies numbness or tingling.  Pt admits to loss of bladder X 2 in the past day.  Ambulatory in triage without difficulty.

## 2014-04-19 NOTE — ED Provider Notes (Signed)
CSN: [id number]     Arrival date & time 04/19/14  1639 History   First MD Initiated Contact with Patient 04/19/14 2034     Chief Complaint  Patient presents with  . Back Pain     (Consider location/radiation/quality/duration/timing/severity/associated sxs/prior Treatment) The history is provided by the patient.     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  States he was lying on the couch watching tv and was suddenly covered in urine.  States his pain has been getting gradually worse over the past month.  Pain is throbbing, worse with movement, radiates down left leg.  Left leg occasionally gives out but denies any current weakness or any difficulty with his gait.  Denies any bowel retention or incontinence.  Since the episode of urinary incontinence 2 days ago he has been urinating normally.  Denies fevers, recent falls or injury.  Denies IVDU.   Has not been able to follow up with specialists because he has [place].  Is taking nothing for pain.    Past Medical History  Diagnosis Date  . Tumor associated pain   . Hypertension    Past Surgical History  Procedure Laterality Date  . Leg surgery     No family history on file. History  Substance Use Topics  . Smoking status: Never Smoker   . Smokeless tobacco: Not on file  . Alcohol Use: No    Review of Systems  All other systems reviewed and are negative.     Allergies  Hydrocodone; Ibuprofen; and Iodine  Home Medications   Prior to Admission medications   Medication Sig Start Date End Date Taking? Authorizing Provider  amLODipine (NORVASC) 10 MG tablet Take 10 mg by mouth daily.   Yes Historical Provider, MD  oxyCODONE-acetaminophen (PERCOCET/ROXICET) 5-325 MG per tablet Take 1 tablet by mouth every 4 (four) hours as needed. Patient not taking: Reported on 04/19/2014  04/04/14   Garlon Hatchet, PA-C  predniSONE (DELTASONE) 10 MG tablet 6,5,4,3,2,1 taper Patient not taking: Reported on 03/05/2014 02/26/14   Lonia Skinner Sofia, PA-C   BP 120/80 mmHg  Pulse 73  Temp(Src) 98.2 F (36.8 C)  Resp 16  SpO2 98% Physical Exam  Constitutional: He appears well-developed and well-nourished. No distress.  HENT:  Head: Normocephalic and atraumatic.  Neck: Neck supple.  Pulmonary/Chest: Effort normal.  Abdominal: Soft. He exhibits no distension. There is no tenderness. There is no rebound and no guarding.  Musculoskeletal:       Back:  Spine no crepitus, or stepoffs. Diffuse tenderness over L spine.  No tenderness of C or T spine. Lower extremities:  Strength 5/5, sensation intact, distal pulses intact.     Neurological: He is alert.  Skin: He is not diaphoretic.  Nursing note and vitals reviewed.   ED Course  Procedures (including critical care time) Labs Review Labs Reviewed  BASIC METABOLIC PANEL - Abnormal; Notable for the following:    Potassium 3.2 (*)    GFR calc non Af Amer 66 (*)    GFR calc Af Amer 77 (*)    All other components within normal limits  CBC WITH DIFFERENTIAL/PLATELET    Imaging Review Mr Lumbar Spine Wo Contrast  04/19/2014   CLINICAL DATA:  Initial evaluation for chronic back pain radiating into bilateral lower legs. Loss of bladder recently.  EXAM:  MRI LUMBAR SPINE WITHOUT CONTRAST  TECHNIQUE: Multiplanar, multisequence MR imaging of the lumbar spine was performed. No intravenous contrast was administered.  COMPARISON:  Prior MRI from 01/05/2010  FINDINGS: For the purposes of this dictation, the lowest well-formed intervertebral disc spaces presumed to be the L5-S1 level, and there presumed to be 5 lumbar type vertebral bodies.  Nonspecific bone lesion within the left aspect of the L5 vertebral body again noted. The predominance of the lesion is a multiloculated cystic component within the left aspect of the L5 vertebral body that  measures 1.5 x 2.7 x 2.1 cm (previously 2.5 x 1.4 x 2.0 cm). Again, there is mild expansion of the left pedicle with associated fatty appearing lesion. Overall, these findings are not significantly changed relative to prior MRI from 2011. 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 No associated pathologic fracture.  No other focal osseous lesions identified  Vertebral bodies are normally aligned with preservation of the normal lumbar lordosis. Mild diffuse shortening of the pedicles noted.  Conus medullaris terminates normally at the L1 level. Nerve roots of the cauda equina are unremarkable. Signal intensity within the visualized cord within normal limits.  No focal disc herniation or significant disc bulging identified. Intervertebral discs remain well hydrated. No significant facet arthrosis identified. No significant canal or foraminal stenosis. No nerve root compression.  IMPRESSION: 1. No evidence for cord compression or significant central canal stenosis. No nerve root compression identified. 2. 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 3. No significant interval change in multi cystic lesion involving the L5 vertebral body. This lesion is indeterminate, but most likely benign given its relative stability from 2011.   Electronically Signed   By: Rise MuBenjamin  McClintock M.D.   On: 04/19/2014 23:24     EKG Interpretation None       Pt filled Rx for #90 oxycodone 5mg  on 04/16/14.  [place] and additionally has several prescriptions from the ED.    MDM   Final diagnoses:  Urinary incontinence  Low back pain  Chronic radicular pain of lower back    Afebrile nontoxic patient with known lesion at L5 and chronic back pain with left lower extremity radiculopathy p/w episode of urinary  incontinence two days ago that has concerned him.  He has had control of his bladder since then.  He has no weakness or exam.  Bladder scan shows 83mL.  MR lumbar spine without cord compression, lesion at L5 stable.  Pt able to ambulate in ED without difficulty.  While being discharged he was upset he was being discharged without pain medication and told the nurse it had been a month since his last prescription.  I printed out and showed him the [facility] oxycodone filled 3 days ago.  I informed him that I would not be prescribing him any narcotic medications and he needed [place] [facility] Dr Gerlene FeePiva.  Pt verbalized understanding. Discussed result, findings, treatment, and follow up  with patient.  Pt given return precautions.  Pt verbalizes understanding and agrees with plan.        Trixie Dredgemily Tesla Bochicchio, PA-C 04/20/14 [id number]  Tilden FossaElizabeth Rees, MD 04/20/14 [id number]

## 2014-04-20 NOTE — ED Notes (Signed)
Pt. Left with all belongings 

## 2014-04-20 NOTE — ED Notes (Signed)
Pt. Ambulated well NAD while ambulating

## 2014-04-30 ENCOUNTER — Emergency Department (HOSPITAL_COMMUNITY): Payer: Medicaid - Out of State

## 2014-04-30 ENCOUNTER — Emergency Department (HOSPITAL_COMMUNITY)
Admission: EM | Admit: 2014-04-30 | Discharge: 2014-04-30 | Disposition: A | Discharge status: Home / Self Care | Payer: Medicaid - Out of State | Attending: Emergency Medicine | Admitting: Emergency Medicine

## 2014-04-30 ENCOUNTER — Encounter (HOSPITAL_COMMUNITY): Payer: Self-pay

## 2014-04-30 DIAGNOSIS — Z79899 Other long term (current) drug therapy: Secondary | ICD-10-CM | POA: Insufficient documentation

## 2014-04-30 DIAGNOSIS — Y9289 Other specified places as the place of occurrence of the external cause: Secondary | ICD-10-CM | POA: Insufficient documentation

## 2014-04-30 DIAGNOSIS — Z86018 Personal history of other benign neoplasm: Secondary | ICD-10-CM | POA: Diagnosis not present

## 2014-04-30 DIAGNOSIS — I10 Essential (primary) hypertension: Secondary | ICD-10-CM | POA: Insufficient documentation

## 2014-04-30 DIAGNOSIS — Y998 Other external cause status: Secondary | ICD-10-CM | POA: Diagnosis not present

## 2014-04-30 DIAGNOSIS — Y9389 Activity, other specified: Secondary | ICD-10-CM | POA: Diagnosis not present

## 2014-04-30 DIAGNOSIS — S3992XA Unspecified injury of lower back, initial encounter: Secondary | ICD-10-CM | POA: Diagnosis not present

## 2014-04-30 DIAGNOSIS — W19XXXA Unspecified fall, initial encounter: Secondary | ICD-10-CM

## 2014-04-30 DIAGNOSIS — G8929 Other chronic pain: Secondary | ICD-10-CM

## 2014-04-30 DIAGNOSIS — M5442 Lumbago with sciatica, left side: Secondary | ICD-10-CM

## 2014-04-30 DIAGNOSIS — W010XXA Fall on same level from slipping, tripping and stumbling without subsequent striking against object, initial encounter: Secondary | ICD-10-CM | POA: Diagnosis not present

## 2014-04-30 DIAGNOSIS — M549 Dorsalgia, unspecified: Secondary | ICD-10-CM

## 2014-04-30 MED ORDER — OXYCODONE-ACETAMINOPHEN 5-325 MG PO TABS: 1.0000 | ORAL_TABLET | Freq: Three times a day (TID) | ORAL | Status: DC | PRN

## 2014-04-30 MED ORDER — OXYCODONE-ACETAMINOPHEN 5-325 MG PO TABS
1.0000 | ORAL_TABLET | Freq: Once | ORAL | Status: AC
  Administered 2014-04-30: 1 via ORAL
  Filled 2014-04-30: qty 1

## 2014-04-30 NOTE — ED Provider Notes (Signed)
CSN: [id number]     Arrival date & time 04/30/14  1338 History  This chart was scribed for Raymon Mutton, PA-C, working with Harrold Donath R. Rubin Payor, MD by Chestine Spore, ED Scribe. The patient was seen in room TR06C/TR06C at 3:05 PM.    Chief Complaint  Patient presents with  . Fall    The history is provided by the patient. No language interpreter was used.    HPI Comments: Garrett Cole is a 44 y.o. male with a PMHx of HTN, tumor associated pain, chronic back pain since 1994 who presents to the Emergency Department complaining of fall onset this morning PTA when he slipped on soap in the shower. Patient reported that his left leg normally gets out on him, has been ongoing for the past 5 years, and stated that today gave out on him while he was in the shower. Reported that the pain is a throbbing,shooting localize the lower back with radiation down the left leg. Patient reports that his pain normally radiates down the left leg. Stated that the pain is worse with walking, denied taking anything over-the-counter. Pt has appointment with Neurologist at [facility] on 05/12/14. Denies head injury, LOC, neck pain, bowel/bladder incontinence, loss of sensation, numbness, tingling. Pt is allergic to hydrocodone but has taken percocet before. PCP none  Past Medical History  Diagnosis Date  . Tumor associated pain   . Hypertension    Past Surgical History  Procedure Laterality Date  . Leg surgery     History reviewed. No pertinent family history. History  Substance Use Topics  . Smoking status: Never Smoker   . Smokeless tobacco: Not on file  . Alcohol Use: No    Review of Systems  Gastrointestinal: Negative for diarrhea.  Genitourinary: Negative for dysuria, urgency and frequency.  Musculoskeletal: Positive for back pain. Negative for neck pain.  Neurological: Negative for syncope.      Allergies  Hydrocodone; Ibuprofen; and Iodine  Home Medications   Prior to Admission  medications   Medication Sig Start Date End Date Taking? Authorizing Provider  amLODipine (NORVASC) 10 MG tablet Take 10 mg by mouth daily.    Historical Provider, MD  oxyCODONE-acetaminophen (PERCOCET/ROXICET) 5-325 MG per tablet Take 1 tablet by mouth every 8 (eight) hours as needed for moderate pain or severe pain. 04/30/14   Angeles Zehner, PA-C  predniSONE (DELTASONE) 10 MG tablet 6,5,4,3,2,1 taper Patient not taking: Reported on 03/05/2014 02/26/14   Lonia Skinner Sofia, PA-C   BP 131/81 mmHg  Pulse 104  Temp(Src) 97.9 F (36.6 C) (Oral)  Resp 18  Ht 6' (1.829 m)  Wt 170 lb (77.111 kg)  BMI 23.05 kg/m2  SpO2 100%  Physical Exam  Constitutional: He is oriented to person, place, and time. He appears well-developed and well-nourished. No distress.  HENT:  Head: Normocephalic and atraumatic.  Eyes: Conjunctivae and EOM are normal. Right eye exhibits no discharge. Left eye exhibits no discharge.  Neck: Normal range of motion. Neck supple.  Cardiovascular: Normal rate, regular rhythm and normal heart sounds.   Pulses:      Radial pulses are 2+ on the right side, and 2+ on the left side.       Dorsalis pedis pulses are 2+ on the right side, and 2+ on the left side.  Pulmonary/Chest: Effort normal and breath sounds normal. No respiratory distress. He has no wheezes. He has no rales.  Musculoskeletal: Normal range of motion. He exhibits tenderness.       Lumbar back:  He exhibits tenderness. He exhibits normal range of motion, no bony tenderness, no swelling, no edema, no deformity, no laceration and no pain.       Back:  Neurological: He is alert and oriented to person, place, and time. No cranial nerve deficit. He exhibits normal muscle tone. Coordination normal.  Strength 5+/5+ to upper and lower extremities bilaterally with resistance applied, equal distribution noted Sensation intact with differentiation sharp and dull touch Negative saddle paresthesias bilaterally  Skin: Skin is warm  and dry. No rash noted. He is not diaphoretic. No erythema.  Psychiatric: He has a normal mood and affect. His behavior is normal. Thought content normal.  Nursing note and vitals reviewed.   ED Course  Procedures (including critical care time) DIAGNOSTIC STUDIES: Oxygen Saturation is 100% on room air, normal by my interpretation.    COORDINATION OF CARE: 3:12 PM-Discussed treatment plan which includes X-ray of L-spine, X-ray of sacrum/coccyx with pt at bedside and pt agreed to plan.     CLINICAL DATA: Initial evaluation for chronic back pain radiating into bilateral lower legs. Loss of bladder recently.  EXAM: MRI LUMBAR SPINE WITHOUT CONTRAST  TECHNIQUE: Multiplanar, multisequence MR imaging of the lumbar spine was performed. No intravenous contrast was administered.  COMPARISON: Prior MRI from 01/05/2010  FINDINGS: For the purposes of this dictation, the lowest well-formed intervertebral disc spaces presumed to be the L5-S1 level, and there presumed to be 5 lumbar type vertebral bodies.  Nonspecific bone lesion within the left aspect of the L5 vertebral body again noted. The predominance of the lesion is a multiloculated cystic component within the left aspect of the L5 vertebral body that measures 1.5 x 2.7 x 2.1 cm (previously 2.5 x 1.4 x 2.0 cm). Again, there is mild expansion of the left pedicle with associated fatty appearing lesion. Overall, these findings are not significantly changed relative to prior MRI from 2011. 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 No associated pathologic fracture.  No other focal osseous lesions identified  Vertebral bodies are normally aligned with preservation of the normal lumbar lordosis. Mild diffuse shortening of the pedicles noted.  Conus medullaris terminates normally at the L1 level. Nerve roots of the cauda equina  are unremarkable. Signal intensity within the visualized cord within normal limits.  No focal disc herniation or significant disc bulging identified. Intervertebral discs remain well hydrated. No significant facet arthrosis identified. No significant canal or foraminal stenosis. No nerve root compression.  IMPRESSION: 1. No evidence for cord compression or significant central canal stenosis. No nerve root compression identified. 2. 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 3. No significant interval change in multi cystic lesion involving the L5 vertebral body. This lesion is indeterminate, but most likely benign given its relative stability from 2011.   Electronically Signed  By: Rise MuBenjamin McClintock M.D.  On: 04/19/2014 23:24  Labs Review Labs Reviewed - No data to display  Imaging Review Dg Lumbar Spine Complete  04/30/2014   CLINICAL DATA:  Larey SeatFell in shower today. Initial encounter. Back pain.  EXAM: LUMBAR SPINE - COMPLETE 4+ VIEW  COMPARISON:  Most recent MRI 04/19/2014. Prior plain films 09/23/2009.  FINDINGS: There are apparent lucencies overlying the L4 and L5 vertebral bodies which are believed to represent bowel gas.  No compression fracture or traumatic subluxation. No significant facet disease or pars defects. Intervertebral disc spaces are preserved.  No pelvic lesions.  Similar appearance to prior lumbar spine plain films and prior MR.  No clear-cut progression of the known lesion in the L5 vertebral body compared with prior imaging.  IMPRESSION: No definite posttraumatic sequelae.  See discussion above   Electronically Signed   By: Davonna Belling M.D.   On: 04/30/2014 14:55   Dg Sacrum/coccyx  04/30/2014   CLINICAL DATA:  Low back pain.  EXAM: SACRUM AND COCCYX - 2+ VIEW  COMPARISON:  None.  FINDINGS: There is no evidence of fracture or other focal bone lesions.  IMPRESSION: Negative.    Electronically Signed   By: Signa Kell M.D.   On: 04/30/2014 14:54     EKG Interpretation None       3:47 PM Discussed case and reviewed imaging in great detail with attending physician, Dr. Jerolyn Shin to plan of discharge with pain medications  MDM   Final diagnoses:  Midline low back pain with left-sided sciatica  Fall, initial encounter  Chronic back pain    Medications  oxyCODONE-acetaminophen (PERCOCET/ROXICET) 5-325 MG per tablet 1 tablet (1 tablet Oral Given 04/30/14 1551)    Filed Vitals:   04/30/14 1342  BP: 131/81  Pulse: 104  Temp: 97.9 F (36.6 C)  TempSrc: Oral  Resp: 18  Height: 6' (1.829 m)  Weight: 170 lb (77.111 kg)  SpO2: 100%   I personally performed the services described in this documentation, which was scribed in my presence. The recorded information has been reviewed and is accurate.   Patient presenting to the ED with back pain after a fall that occurred this afternoon while in the shower. Patient has a long history of back pain, since 1994. Patient reported that he has been having pain radiating down his left leg intermittent numbness tingling/weakness the past 5 years-chronic issue. Patient was recently seen in the ED setting on 04/19/2014 regarding back pain. MRI negative for acute abnormalities - multicystic lesion identified to the L5 vertebral body, stable since 2011. Plain film of lumbar spine no definite post traumatic sequelae. Plain films of coccyx/sacrum negative for acute osseous abnormalities. Negative focal neurological deficits noted. Sensation intact. Pulses palpable and strong. Negative signs of ischemia. Doubt cauda equina. Doubt epidural abscess. Suspicion to be bone contusion secondary to fall exacerbation of chronic back pain.Patient stable, afebrile. Patient not septic appearing. Discharged patient. Discharge patient with a small dose of pain medications-discussed course, precautions, disposal technique. Discussed with  patient to rest and stay hydrated. Discussed with patient to avoid any physical shortness activity. Referred patient to orthopedics. Discussed with patient to closely monitor symptoms and if symptoms are to worsen or change to report back to the ED - strict return instructions given.  Patient agreed to plan of care, understood, all questions answered.   Raymon Mutton, PA-C 04/30/14 [address] PA-C 04/30/14 1610  Juliet Rude. Rubin Payor, MD 05/04/14 [phone]

## 2014-04-30 NOTE — Discharge Instructions (Signed)
Please call your doctor for a followup appointment within 24-48 hours. When you talk to your doctor please let them know that you were seen in the emergency department and have them acquire all of your records so that they can discuss the findings with you and formulate a treatment plan to fully care for your new and ongoing problems. Please rest and stay hydrated Please avoid any physical or strenuous activity Please apply warm compressions and massage Please follow-up with orthopedics Please follow through with neurosurgeon Please take medications as prescribed - while on pain medications there is to be no drinking alcohol, driving, operating any heavy machinery. If extra please dispose in a proper manner. Please do not take any extra Tylenol with this medication for this can lead to Tylenol overdose and liver issues.  Please continue to monitor symptoms closely and if symptoms are to worsen or change (fever greater than 101, chills, sweating, nausea, vomiting, chest pain, shortness of breathe, difficulty breathing, weakness, numbness, tingling, worsening or changes to pain pattern, fall, injury, numbness, tingling, loss of sensation, inability to control urine or bowel movements) please report back to the Emergency Department immediately.   Back Pain, Adult Low back pain is very common. About 1 in 5 people have back pain.The cause of low back pain is rarely dangerous. The pain often gets better over time.About half of people with a sudden onset of back pain feel better in just 2 weeks. About 8 in 10 people feel better by 6 weeks.  CAUSES Some common causes of back pain include:  Strain of the muscles or ligaments supporting the spine.  Wear and tear (degeneration) of the spinal discs.  Arthritis.  Direct injury to the back. DIAGNOSIS Most of the time, the direct cause of low back pain is not known.However, back pain can be treated effectively even when the exact cause of the pain is  unknown.Answering your caregiver's questions about your overall health and symptoms is one of the most accurate ways to make sure the cause of your pain is not dangerous. If your caregiver needs more information, he or she may order lab work or imaging tests (X-rays or MRIs).However, even if imaging tests show changes in your back, this usually does not require surgery. HOME CARE INSTRUCTIONS For many people, back pain returns.Since low back pain is rarely dangerous, it is often a condition that people can learn to [facility] their own.   Remain active. It is stressful on the back to sit or stand in one place. Do not sit, drive, or stand in one place for more than 30 minutes at a time. Take short walks on level surfaces as soon as pain allows.Try to increase the length of time you walk each day.  Do not stay in bed.Resting more than 1 or 2 days can delay your recovery.  Do not avoid exercise or work.Your body is made to move.It is not dangerous to be active, even though your back may hurt.Your back will likely heal faster if you return to being active before your pain is gone.  Pay attention to your body when you bend and lift. Many people have less discomfortwhen lifting if they bend their knees, keep the load close to their bodies,and avoid twisting. Often, the most comfortable positions are those that put less stress on your recovering back.  Find a comfortable position to sleep. Use a firm mattress and lie on your side with your knees slightly bent. If you lie on your back, put  a pillow under your knees.  Only take over-the-counter or prescription medicines as directed by your caregiver. Over-the-counter medicines to reduce pain and inflammation are often the most helpful.Your caregiver may prescribe muscle relaxant drugs.These medicines help dull your pain so you can more quickly return to your normal activities and healthy exercise.  Put ice on the injured area.  Put ice in a  plastic bag.  Place a towel between your skin and the bag.  Leave the ice on for 15-20 minutes, 03-04 times a day for the first 2 to 3 days. After that, ice and heat may be alternated to reduce pain and spasms.  Ask your caregiver about trying back exercises and gentle massage. This may be of some benefit.  Avoid feeling anxious or stressed.Stress increases muscle tension and can worsen back pain.It is important to recognize when you are anxious or stressed and learn ways to manage it.Exercise is a great option. SEEK MEDICAL CARE IF:  You have pain that is not relieved with rest or medicine.  You have pain that does not improve in 1 week.  You have new symptoms.  You are generally not feeling well. SEEK IMMEDIATE MEDICAL CARE IF:   You have pain that radiates from your back into your legs.  You develop new bowel or bladder control problems.  You have unusual weakness or numbness in your arms or legs.  You develop nausea or vomiting.  You develop abdominal pain.  You feel faint. Document Released: 02/26/2005 Document Revised: 08/28/2011 Document Reviewed: 06/30/2013 [facility] Patient Information 2015 [place], [place]. This information is not intended to replace advice given to you by your health care provider. Make sure you discuss any questions you have with your health care provider.   Emergency Department Resource Guide 1) Find a Doctor and Pay Out of Pocket Although you won't have to find out who is covered by your insurance plan, it is a good idea to ask around and get recommendations. You will then need to call the office and see if the doctor you have chosen will accept you as a new patient and what types of options they offer for patients who are self-pay. Some doctors offer discounts or will set up payment plans for their patients who do not have insurance, but you will need to ask so you aren't surprised when you get to your appointment.  2) Contact Your Local Health  Department Not all health departments have doctors that can see patients for sick visits, but many do, so it is worth a call to see if yours does. If you don't know where your local health department is, you can check in your phone book. The CDC also has a tool to help you locate your state's health department, and many state websites also have listings of all of their local health departments.  3) Find a Walk-in Clinic If your illness is not likely to be very severe or complicated, you may want to try a walk in clinic. These are popping up all over the country in pharmacies, drugstores, and shopping centers. They're usually staffed by nurse practitioners or physician assistants that have been trained to treat common illnesses and complaints. They're usually fairly quick and inexpensive. However, if you have serious medical issues or chronic medical problems, these are probably not your best option.  No Primary Care Doctor: - Call Health Connect at  [phone] - they can help you locate a primary care doctor that  accepts your insurance, provides certain  services, etc. - Physician Referral Service- [phone]  Chronic Pain Problems: Organization         Address  Phone   Notes  [facility]  [phone]) [phone] Patients need to be referred by their primary care doctor.   Medication Assistance: Organization         Address  Phone   Notes  [facility] [address] [place], [place] [address] [phone] --Must be a resident of [place] [facility] -- Must have NO insurance coverage whatsoever (no Medicaid/ Medicare, etc.) -- The pt. MUST have a primary care doctor that directs their care regularly and follows them in the community   MedAssist  [phone]   [employer]  [phone]    Agencies that provide inexpensive medical care: Organization         Address  Phone   Notes  [facility]  [phone]   [facility]    [phone](336) [phone]   [facility] [address] [place], [place] [address] [phone]) [phone]   [facility] [place] [address], [place] [phone]) [phone]   [facility]anned Parenthood    [phone](336) [phone]   [facility]    [phone]   [facility]  [address], [place] Phone:  [phone](336) [phone], Fax:  [phone](336) [phone] Hours of Operation:  9 am - 6 pm, M-F.  Also accepts Medicaid/Medicare and self-pay.  [facility]  [address], [place] Phone: [phone]) [phone], Fax: [phone](336) [phone]. Hours of Operation:  8:30 am - 5:30 pm, M-F.  Also accepts Medicaid and self-pay.  [facility] [place] [address], [place] Phone: [phone]) [phone]   [facility] [address], [place], [place] [phone], Ext. [phone] Mondays & Thursdays: 7-9 AM.  First 15 patients are seen on a first come, first serve basis.    Medicaid-accepting [facility] Providers:  Organization         Address  Phone   Notes  [facility] [address], [place] [phone]) [phone] Also accepts self-pay patients.  [facility] [address] [place], [place]  [phone](336) [phone]   [facility] [address], [place] [phone](336) [phone]   [facility] [address] [place] [address], [place] [phone](336) [phone]   Renaye RakersVeita Bland [address], [place]   [phone]) [phone] Only accepts [place] Access [place] patients after they have their name applied to their card.   Self-Pay (no insurance) in [facility]:  Organization         Address  Phone   Notes  Sickle Cell Patients, [facility] [address], [place] [phone]) [phone]   [facility] [address], [place] [phone]) [phone]   [facility] [place]  [address],  [place] [phone]) [phone]   [facility]/Dr. Osei-Bonsu  [address] [place] [address], [place] or [phone], [place] [phone] Phone number for both [place] and [place] locations is the same.  [facility] and [facility] [address], [place] [phone]   [facility]  [address] [place] [address], [place] or [address] [phone]) [phone]) [phone]   [facility] [address],  [place] [phone], phone; [phone], fax Sees patients 1st and 3rd Saturday of every month.  Must not qualify for public or private insurance (i.e. Medicaid, Medicare, [place] Health Choice, Veterans' Benefits)  Household income should be no more than 200% of the poverty level The clinic cannot treat you if you are pregnant or think you are pregnant  Sexually transmitted diseases are not treated at the clinic.    Dental Care: Organization         Address  Phone  Notes  [facility] Department of [facility] [address] [place], [place] [phone] Accepts children up to age 36 who are enrolled in [place] or [place] Health Choice; pregnant women with a Medicaid card; and children who have applied for Medicaid or [place] Health Choice, but were declined, whose parents can pay a reduced fee at time of service.  [facility] Department of [facility]  [address], [place] [phone] Accepts children up to age 60 who are enrolled in [place] or [place] Health Choice; pregnant women with a Medicaid card; and children who have applied for Medicaid or [place] Health Choice, but were declined, whose parents can pay a reduced fee at time of service.  [facility]  [address] [place], [place] [phone] Patients are seen by appointment only. Walk-ins are not accepted. [facility] will see patients 41 years of age and older. Monday - Tuesday (8am-5pm) Most Wednesdays  (8:30-5pm) $30 per visit, cash only  [facility] Access PROGRAM  [address], [facility] [phone] Patients are seen by appointment only. Walk-ins are not accepted. [facility] will see patients 49 years of age and older. One Wednesday Evening (Monthly: Volunteer Based).  $30 per visit, cash only  [employer]  [phone] for adults; Children under age 86, call Graduate Pediatric Dentistry at [phone]. Children aged 67-14, please call [phone] to request a pediatric application.  Dental services are provided in all areas of dental care including fillings, crowns and bridges, complete and partial dentures, implants, gum treatment, root canals, and extractions. Preventive care is also provided. Treatment is provided to both adults and children. Patients are selected via a lottery and there is often a waiting list.   [facility] [address] [place]  [phone] [email or web address]   [facility] [address] [place], [place] [phone], Ext. [phone] Second and Fourth Thursday of each month, opens at 6:30 AM; Clinic ends at 9 AM.  Patients are seen on a first-come first-served basis, and a limited number are seen during each clinic.   [facility]  [address] [place], [place] [phone]   Eligibility Requirements You must have lived in [place], [place], or [place] counties for at least the last three months.   You cannot be eligible for state or federal sponsored [place], including [employer], [place], or [employer].   You generally cannot be eligible for healthcare insurance through your employer.    How to apply: Eligibility screenings are held every Tuesday and Wednesday afternoon from 1:00 pm until 4:00 pm. You do not need an appointment for the interview!  [facility] [address], [place], [place] [phone]   [facility]  Health Department  [phone]   [facility] Health Department  [phone]   [facility] Health Department  [phone]    Behavioral  Health Resources in the Community: Intensive Outpatient Programs Organization         Address  Phone  Notes  [facility] [address] [place], [place] [phone]   [facility] [address], [place], [place] [phone]   [facility] [address] [place], [place]  [phone]   [facility] Mental Health [address],  [place], [place] [phone] or [phone]   Substance Abuse Resources Organization         Address  Phone  Notes  Alcohol and Drug Services  [phone]   [facility]  [phone]   [facility] [place]  [phone]   Floydene Flock  [phone]   Residential & Outpatient Substance Abuse Program  [phone]   Psychological Services Organization         Address  Phone  Notes  [facility] Behavioral Health  [phone]   [facility] Services  [phone]   [facility] [address], [place] [phone] or [phone]    Mobile Crisis Teams Organization         Address  Phone  Notes  [facility], Mobile Crisis Care Unit  [phone]   [facility]  [address] [place], [place] [phone]   Doristine Locks [address] [place] [phone]    Self-Help/Support Groups Organization         Address  Phone             Notes  [facility] of [place] - variety of support groups  336- I7437963 Call for more information  Narcotics Anonymous (NA), [facility] [address], [employer] [place]  2 meetings at this location   Statistician         Address  Phone  Notes  [facility] [address],    [place]  [phone]   [facility]  [address], [place] [address], [place], [place]  [phone]   [facility] [address] [place], [place] [phone] Admissions: 8am-3pm M-F  [facility] [address]    [place], [place] [phone]   [facility] [address] [place], [place], [place] [phone]   [facility] [address]  [place], [place] [phone]   [facility] - Intensive Outpatient [address], [place], [place] [phone]   [facility] (Addiction Recovery [facility] [address]  [place], [place] [phone] or [phone]   [facility] (RTS) [address], [place], [place] [phone] Accepts Medicaid  [facility] [address],  [place] [phone] Substance Abuse/Addiction Treatment   [facility] Organization         Address  Phone  Notes  [facility]  [phone]   Angie Fava, PhD [address] [place], [place]   [phone] or [phone]   [facility] Behavioral   [address] [place], [place] [phone]   [facility] [address], [place], [place] [phone] Insurance/Medicaid/sponsorship through [employer] and Families [address]                                    [place], [place] [phone] Therapy/tele-psych/case  [facility] [address]   [place], [place] (  [phone] W1638013    Dr. Lolly Mustache  [phone]   [facility]  [employer] [facility] Dept. 1) [address] [place] 2) [address] [place] 3)  [address], [place] [phone]  [phone]   [facility] Child Abuse Hotline (678)[phone] or [phone] (After Hours)

## 2014-04-30 NOTE — ED Notes (Signed)
Pt presents with low back and buttock pain after falling in bathtub this morning.  Pt reports since hernia repair to his calf in 1991, L leg will jerk, causing him to fall, pt reports falling in tub due to that.  Pt denies LOC, denies neck pain.

## 2014-05-08 ENCOUNTER — Encounter (HOSPITAL_COMMUNITY): Payer: Self-pay | Admitting: Emergency Medicine

## 2014-05-08 ENCOUNTER — Emergency Department (HOSPITAL_COMMUNITY)
Admission: EM | Admit: 2014-05-08 | Discharge: 2014-05-08 | Disposition: A | Discharge status: Home / Self Care | Payer: No Typology Code available for payment source | Attending: Emergency Medicine | Admitting: Emergency Medicine

## 2014-05-08 DIAGNOSIS — Z79899 Other long term (current) drug therapy: Secondary | ICD-10-CM | POA: Insufficient documentation

## 2014-05-08 DIAGNOSIS — R3915 Urgency of urination: Secondary | ICD-10-CM | POA: Insufficient documentation

## 2014-05-08 DIAGNOSIS — G8929 Other chronic pain: Secondary | ICD-10-CM | POA: Insufficient documentation

## 2014-05-08 DIAGNOSIS — M545 Low back pain, unspecified: Secondary | ICD-10-CM

## 2014-05-08 DIAGNOSIS — D649 Anemia, unspecified: Secondary | ICD-10-CM | POA: Insufficient documentation

## 2014-05-08 DIAGNOSIS — R195 Other fecal abnormalities: Secondary | ICD-10-CM | POA: Insufficient documentation

## 2014-05-08 DIAGNOSIS — I10 Essential (primary) hypertension: Secondary | ICD-10-CM | POA: Insufficient documentation

## 2014-05-08 LAB — CBC WITH DIFFERENTIAL/PLATELET
BASOS PCT: 1 % (ref 0–1)
Basophils Absolute: 0.1 10*3/uL (ref 0.0–0.1)
EOS ABS: 0.3 10*3/uL (ref 0.0–0.7)
Eosinophils Relative: 4 % (ref 0–5)
HEMATOCRIT: 35.6 % — AB (ref 39.0–52.0)
HEMOGLOBIN: 11.6 g/dL — AB (ref 13.0–17.0)
LYMPHS ABS: 1.7 10*3/uL (ref 0.7–4.0)
Lymphocytes Relative: 23 % (ref 12–46)
MCH: 28.6 pg (ref 26.0–34.0)
MCHC: 32.6 g/dL (ref 30.0–36.0)
MCV: 87.7 fL (ref 78.0–100.0)
MONO ABS: 0.5 10*3/uL (ref 0.1–1.0)
MONOS PCT: 7 % (ref 3–12)
NEUTROS PCT: 65 % (ref 43–77)
Neutro Abs: 4.9 10*3/uL (ref 1.7–7.7)
PLATELETS: 201 10*3/uL (ref 150–400)
RBC: 4.06 MIL/uL — ABNORMAL LOW (ref 4.22–5.81)
RDW: 15.2 % (ref 11.5–15.5)
WBC: 7.4 10*3/uL (ref 4.0–10.5)

## 2014-05-08 LAB — BASIC METABOLIC PANEL
ANION GAP: 4 — AB (ref 5–15)
BUN: 6 mg/dL (ref 6–23)
CO2: 27 mmol/L (ref 19–32)
Calcium: 8.2 mg/dL — ABNORMAL LOW (ref 8.4–10.5)
Chloride: 107 mmol/L (ref 96–112)
Creatinine, Ser: 1.06 mg/dL (ref 0.50–1.35)
GFR calc non Af Amer: 83 mL/min — ABNORMAL LOW (ref >90)
Glucose, Bld: 86 mg/dL (ref 70–99)
Potassium: 3.3 mmol/L — ABNORMAL LOW (ref 3.5–5.1)
Sodium: 138 mmol/L (ref 135–145)

## 2014-05-08 LAB — URINALYSIS, ROUTINE W REFLEX MICROSCOPIC
Bilirubin Urine: NEGATIVE
Glucose, UA: NEGATIVE mg/dL
HGB URINE DIPSTICK: NEGATIVE
KETONES UR: NEGATIVE mg/dL
LEUKOCYTES UA: NEGATIVE
Nitrite: NEGATIVE
PROTEIN: NEGATIVE mg/dL
SPECIFIC GRAVITY, URINE: 1.014 (ref 1.005–1.030)
Urobilinogen, UA: 1 mg/dL (ref 0.0–1.0)
pH: 6.5 (ref 5.0–8.0)

## 2014-05-08 LAB — RAPID URINE DRUG SCREEN, HOSP PERFORMED
Amphetamines: NOT DETECTED
Barbiturates: NOT DETECTED
Benzodiazepines: POSITIVE — AB
COCAINE: NOT DETECTED
Opiates: NOT DETECTED
Tetrahydrocannabinol: NOT DETECTED

## 2014-05-08 LAB — POC OCCULT BLOOD, ED: Fecal Occult Bld: NEGATIVE

## 2014-05-08 MED ORDER — OXYCODONE-ACETAMINOPHEN 5-325 MG PO TABS: 1.0000 | ORAL_TABLET | Freq: Four times a day (QID) | ORAL | Status: DC | PRN

## 2014-05-08 MED ORDER — OXYCODONE-ACETAMINOPHEN 5-325 MG PO TABS: 1.0000 | ORAL_TABLET | Freq: Once | ORAL | Status: DC

## 2014-05-08 MED ORDER — DEXAMETHASONE SODIUM PHOSPHATE 10 MG/ML IJ SOLN
10.0000 mg | Freq: Once | INTRAMUSCULAR | Status: AC
  Administered 2014-05-08: 10 mg via INTRAMUSCULAR
  Filled 2014-05-08: qty 1

## 2014-05-08 MED ORDER — POTASSIUM CHLORIDE CRYS ER 20 MEQ PO TBCR
20.0000 meq | EXTENDED_RELEASE_TABLET | Freq: Once | ORAL | Status: AC
  Administered 2014-05-08: 20 meq via ORAL
  Filled 2014-05-08: qty 1

## 2014-05-08 NOTE — ED Notes (Signed)
PA at the bedside.

## 2014-05-08 NOTE — ED Provider Notes (Addendum)
CSN: [id number]     Arrival date & time 05/08/14  1458 History   First MD Initiated Contact with Patient 05/08/14 1932     Chief Complaint  Patient presents with  . Back Pain  . Blood In Stools     (Consider location/radiation/quality/duration/timing/severity/associated sxs/prior Treatment) HPI   Patient with chronic back pain with radicular pain into left leg and known L5 multiloculated cystic lesion presents with increase in his chronic back pain x 3 days, urinary urgency, and hematochezia.  Pt states that a few days ago his pain increased.  The pain is located across his lower back, described as "pulling" with radiation into his left leg, associated left leg numbness.  The leg gives out occasionally but otherwise pt denies any numbness.  He has had 3 episodes of urinary incontinence this month, which he describes as "I feel like I have to go real bad" and then he doesn't make it to the bathroom in time.  He also notes that he has episodes of feeling that he "has to go really bad" then it's "just sprinkles."  Today he had a single episode of BRBPR, noted to be on the toilet paper and in the bowl - his stool was normal.  He denies fevers, chills, lightheadedness/dizziness, SOB, abdominal pain, dysuria, penile discharge, saddle anesthesia.  No hx prostate problems.   Past Medical History  Diagnosis Date  . Tumor associated pain   . Hypertension    Past Surgical History  Procedure Laterality Date  . Leg surgery     No family history on file. History  Substance Use Topics  . Smoking status: Never Smoker   . Smokeless tobacco: Not on file  . Alcohol Use: No    Review of Systems  All other systems reviewed and are negative.     Allergies  Hydrocodone; Ibuprofen; and Iodine  Home Medications   Prior to Admission medications   Medication Sig Start Date End Date Taking? Authorizing Provider  acetaminophen (TYLENOL) 500 MG tablet Take 1,000 mg by mouth every 6 (six) hours as  needed (pain).   Yes Historical Provider, MD  amLODipine (NORVASC) 10 MG tablet Take 10 mg by mouth daily.   Yes Historical Provider, MD  oxyCODONE-acetaminophen (PERCOCET/ROXICET) 5-325 MG per tablet Take 1 tablet by mouth every 8 (eight) hours as needed for moderate pain or severe pain. Patient not taking: Reported on 05/08/2014 04/30/14   Marissa Sciacca, PA-C  predniSONE (DELTASONE) 10 MG tablet 6,5,4,3,2,1 taper Patient not taking: Reported on 03/05/2014 02/26/14   Elson AreasLeslie K Sofia, PA-C   BP 116/81 mmHg  Pulse 81  Temp(Src) 97.9 F (36.6 C)  Resp 14  Ht 6' (1.829 m)  Wt 170 lb (77.111 kg)  BMI 23.05 kg/m2  SpO2 100% Physical Exam  Constitutional: He appears well-developed and well-nourished. No distress.  HENT:  Head: Normocephalic and atraumatic.  Neck: Neck supple.  Cardiovascular: Normal rate and regular rhythm.   Pulmonary/Chest: Effort normal and breath sounds normal. No respiratory distress. He has no wheezes. He has no rales.  Abdominal: Soft. He exhibits no distension and no mass. There is tenderness in the suprapubic area. There is no rebound and no guarding.  Genitourinary: Rectal exam shows no external hemorrhoid, no internal hemorrhoid, no fissure, no tenderness and anal tone normal. Guaiac negative stool.  ?enlarged prostate  Musculoskeletal: He exhibits no edema.  Spine nontender, no crepitus, or stepoffs. Lower extremities:  Strength 5/5, sensation intact, distal pulses intact.     Neurological:  He is alert. He exhibits normal muscle tone.  Skin: He is not diaphoretic.  Nursing note and vitals reviewed.   ED Course  Procedures (including critical care time) Labs Review Labs Reviewed  URINE RAPID DRUG SCREEN (HOSP PERFORMED) - Abnormal; Notable for the following:    Benzodiazepines POSITIVE (*)    All other components within normal limits  BASIC METABOLIC PANEL - Abnormal; Notable for the following:    Potassium 3.3 (*)    Calcium 8.2 (*)    GFR calc non Af  Amer 83 (*)    Anion gap 4 (*)    All other components within normal limits  CBC WITH DIFFERENTIAL/PLATELET - Abnormal; Notable for the following:    RBC 4.06 (*)    Hemoglobin 11.6 (*)    HCT 35.6 (*)    All other components within normal limits  URINALYSIS, ROUTINE W REFLEX MICROSCOPIC  POC OCCULT BLOOD, ED    Imaging Review No results found.   EKG Interpretation None       I questioned patient about his excessive sleepiness while in the ED and his positive benzo result.  He states he has recently been started on  PO daily xanax by his psychiatrist.  Per DEA database, this is the case, though pt has been on it for several months.    MDM   Final diagnoses:  Chronic low back pain  Urinary urgency  Mild anemia    Afebrile, nontoxic patient with chronic back pain and LLE radiculopathy p/w urinary urgency and single episode of hematochezia.  He has no symptoms of anemia.  The urinary incontinence he described is not true incontinence but more consistent with urgency and not making it to the bathroom in time.  There are no red flags for back pain.  Pt has been seen in ED several times for similar complaints.  Has a follow up appointment on 05/12/14 with a neurologist (per paperwork he provided from [place]).  UA negative.  Urine drug screen shows benzos, please see note above.  Hemoccult negative.  Rectal exam significant for possible enlarged prostate.  Pt strongly encouraged to follow up with primary care provider regarding prostate and also for anemia.  Hgb 11.9  K 3.3, given  PO.  Labs otherwise unremarkable.  D/C home with #6 percocet.  Pt to follow up with neurology and PCP.  Discussed result, findings, treatment, and follow up  with patient.  Pt given return precautions.  Pt verbalizes understanding and agrees with plan.         Trixie Dredge, PA-C 05/08/14 2347  Vanetta Mulders, MD 05/09/14 2255  Trixie Dredge, PA-C 05/11/14 2349  Vanetta Mulders, MD 05/13/14 [phone]

## 2014-05-08 NOTE — Discharge Instructions (Signed)
Read the information below.  Use the prescribed medication as directed.  Please discuss all new medications with your pharmacist.  Do not take additional tylenol while taking the prescribed pain medication to avoid overdose.  You may return to the Emergency Department at any time for worsening condition or any new symptoms that concern you.    If you develop fevers, loss of control of bowel or bladder, weakness or numbness in your legs, or are unable to walk, return to the ER for a recheck.    Chronic Back Pain  When back pain lasts longer than 3 months, it is called chronic back pain.People with chronic back pain often go through certain periods that are more intense (flare-ups).  CAUSES Chronic back pain can be caused by wear and tear (degeneration) on different structures in your back. These structures include:  The bones of your spine (vertebrae) and the joints surrounding your spinal cord and nerve roots (facets).  The strong, fibrous tissues that connect your vertebrae (ligaments). Degeneration of these structures may result in pressure on your nerves. This can lead to constant pain. HOME CARE INSTRUCTIONS  Avoid bending, heavy lifting, prolonged sitting, and activities which make the problem worse.  Take brief periods of rest throughout the day to reduce your pain. Lying down or standing usually is better than sitting while you are resting.  Take over-the-counter or prescription medicines only as directed by your caregiver. SEEK IMMEDIATE MEDICAL CARE IF:   You have weakness or numbness in one of your legs or feet.  You have trouble controlling your bladder or bowels.  You have nausea, vomiting, abdominal pain, shortness of breath, or fainting. Document Released: 04/05/2004 Document Revised: 05/21/2011 Document Reviewed: 02/10/2011 [facility] Patient Information 2015 [place], [place]. This information is not intended to replace advice given to you by your health care provider. Make  sure you discuss any questions you have with your health care provider.  Anemia, Nonspecific Anemia is a condition in which the concentration of red blood cells or hemoglobin in the blood is below normal. Hemoglobin is a substance in red blood cells that carries oxygen to the tissues of the body. Anemia results in not enough oxygen reaching these tissues.  CAUSES  Common causes of anemia include:   Excessive bleeding. Bleeding may be internal or external. This includes excessive bleeding from periods (in women) or from the intestine.   Poor nutrition.   Chronic kidney, thyroid, and liver disease.  Bone marrow disorders that decrease red blood cell production.  Cancer and treatments for cancer.  HIV, AIDS, and their treatments.  Spleen problems that increase red blood cell destruction.  Blood disorders.  Excess destruction of red blood cells due to infection, medicines, and autoimmune disorders. SIGNS AND SYMPTOMS   Minor weakness.   Dizziness.   Headache.  Palpitations.   Shortness of breath, especially with exercise.   Paleness.  Cold sensitivity.  Indigestion.  Nausea.  Difficulty sleeping.  Difficulty concentrating. Symptoms may occur suddenly or they may develop slowly.  DIAGNOSIS  Additional blood tests are often needed. These help your health care provider determine the best treatment. Your health care provider will check your stool for blood and look for other causes of blood loss.  TREATMENT  Treatment varies depending on the cause of the anemia. Treatment can include:   Supplements of iron, vitamin B12, or folic acid.   Hormone medicines.   A blood transfusion. This may be needed if blood loss is severe.   Hospitalization.  This may be needed if there is significant continual blood loss.   Dietary changes.  Spleen removal. HOME CARE INSTRUCTIONS Keep all follow-up appointments. It often takes many weeks to correct anemia, and having  your health care provider check on your condition and your response to treatment is very important. SEEK IMMEDIATE MEDICAL CARE IF:   You develop extreme weakness, shortness of breath, or chest pain.   You become dizzy or have trouble concentrating.  You develop heavy vaginal bleeding.   You develop a rash.   You have bloody or black, tarry stools.   You faint.   You vomit up blood.   You vomit repeatedly.   You have abdominal pain.  You have a fever or persistent symptoms for more than 2-3 days.   You have a fever and your symptoms suddenly get worse.   You are dehydrated.  MAKE SURE YOU:  Understand these instructions.  Will watch your condition.  Will get help right away if you are not doing well or get worse. Document Released: 04/05/2004 Document Revised: 10/29/2012 Document Reviewed: 08/22/2012 [facility] Patient Information 2015 [place], [place]. This information is not intended to replace advice given to you by your health care provider. Make sure you discuss any questions you have with your health care provider.

## 2014-05-08 NOTE — ED Notes (Signed)
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 Recently his left leg has been numb and giving out. Pt also reports urinating when he coughs and dark blood in stool. Pt has apt next week with neurosurgeon but was told to go ahead and come to ER.

## 2014-05-21 ENCOUNTER — Encounter (HOSPITAL_COMMUNITY): Payer: Self-pay | Admitting: Cardiology

## 2014-05-21 ENCOUNTER — Emergency Department (HOSPITAL_COMMUNITY)
Admission: EM | Admit: 2014-05-21 | Discharge: 2014-05-21 | Disposition: A | Discharge status: Home / Self Care | Payer: Medicaid - Out of State | Attending: Emergency Medicine | Admitting: Emergency Medicine

## 2014-05-21 DIAGNOSIS — Y998 Other external cause status: Secondary | ICD-10-CM | POA: Diagnosis not present

## 2014-05-21 DIAGNOSIS — Y9389 Activity, other specified: Secondary | ICD-10-CM | POA: Insufficient documentation

## 2014-05-21 DIAGNOSIS — Z79899 Other long term (current) drug therapy: Secondary | ICD-10-CM | POA: Insufficient documentation

## 2014-05-21 DIAGNOSIS — W19XXXA Unspecified fall, initial encounter: Secondary | ICD-10-CM

## 2014-05-21 DIAGNOSIS — S199XXA Unspecified injury of neck, initial encounter: Secondary | ICD-10-CM | POA: Diagnosis present

## 2014-05-21 DIAGNOSIS — I10 Essential (primary) hypertension: Secondary | ICD-10-CM | POA: Insufficient documentation

## 2014-05-21 DIAGNOSIS — W06XXXA Fall from bed, initial encounter: Secondary | ICD-10-CM | POA: Diagnosis not present

## 2014-05-21 DIAGNOSIS — Z7952 Long term (current) use of systemic steroids: Secondary | ICD-10-CM | POA: Diagnosis not present

## 2014-05-21 DIAGNOSIS — Y9289 Other specified places as the place of occurrence of the external cause: Secondary | ICD-10-CM | POA: Diagnosis not present

## 2014-05-21 DIAGNOSIS — G8929 Other chronic pain: Secondary | ICD-10-CM | POA: Diagnosis not present

## 2014-05-21 DIAGNOSIS — M542 Cervicalgia: Secondary | ICD-10-CM

## 2014-05-21 MED ORDER — KETOROLAC TROMETHAMINE 60 MG/2ML IM SOLN
30.0000 mg | Freq: Once | INTRAMUSCULAR | Status: AC
  Administered 2014-05-21: 30 mg via INTRAMUSCULAR
  Filled 2014-05-21: qty 2

## 2014-05-21 NOTE — Discharge Instructions (Signed)
Cervical Sprain A cervical sprain is an injury in the neck in which the strong, fibrous tissues (ligaments) that connect your neck bones stretch or tear. Cervical sprains can range from mild to severe. Severe cervical sprains can cause the neck vertebrae to be unstable. This can lead to damage of the spinal cord and can result in serious nervous system problems. The amount of time it takes for a cervical sprain to get better depends on the cause and extent of the injury. Most cervical sprains heal in 1 to 3 weeks. CAUSES  Severe cervical sprains may be caused by:   Contact sport injuries (such as from football, rugby, wrestling, hockey, auto racing, gymnastics, diving, martial arts, or boxing).   Motor vehicle collisions.   Whiplash injuries. This is an injury from a sudden forward and backward whipping movement of the head and neck.  Falls.  Mild cervical sprains may be caused by:   Being in an awkward position, such as while cradling a telephone between your ear and shoulder.   Sitting in a chair that does not offer proper support.   Working at a poorly Landscape [employer] station.   Looking up or down for long periods of time.  SYMPTOMS   Pain, soreness, stiffness, or a burning sensation in the front, back, or sides of the neck. This discomfort may develop immediately after the injury or slowly, 24 hours or more after the injury.   Pain or tenderness directly in the middle of the back of the neck.   Shoulder or upper back pain.   Limited ability to move the neck.   Headache.   Dizziness.   Weakness, numbness, or tingling in the hands or arms.   Muscle spasms.   Difficulty swallowing or chewing.   Tenderness and swelling of the neck.  DIAGNOSIS  Most of the time your health care provider can diagnose a cervical sprain by taking your history and doing a physical exam. Your health care provider will ask about previous neck injuries and any known neck  problems, such as arthritis in the neck. X-rays may be taken to find out if there are any other problems, such as with the bones of the neck. Other tests, such as a CT scan or MRI, may also be needed.  TREATMENT  Treatment depends on the severity of the cervical sprain. Mild sprains can be treated with rest, keeping the neck in place (immobilization), and pain medicines. Severe cervical sprains are immediately immobilized. Further treatment is done to help with pain, muscle spasms, and other symptoms and may include:  Medicines, such as pain relievers, numbing medicines, or muscle relaxants.   Physical therapy. This may involve stretching exercises, strengthening exercises, and posture training. Exercises and improved posture can help stabilize the neck, strengthen muscles, and help stop symptoms from returning.  HOME CARE INSTRUCTIONS   Put ice on the injured area.   Put ice in a plastic bag.   Place a towel between your skin and the bag.   Leave the ice on for 15-20 minutes, 3-4 times a day.   If your injury was severe, you may have been given a cervical collar to wear. A cervical collar is a two-piece collar designed to keep your neck from moving while it heals.  Do not remove the collar unless instructed by your health care provider.  If you have long hair, keep it outside of the collar.  Ask your health care provider before making any adjustments to your collar. Minor  adjustments may be required over time to improve comfort and reduce pressure on your chin or on the back of your head.  Ifyou are allowed to remove the collar for cleaning or bathing, follow your health care provider's instructions on how to do so safely.  Keep your collar clean by wiping it with mild soap and water and drying it completely. If the collar you have been given includes removable pads, remove them every 1-2 days and hand wash them with soap and water. Allow them to air dry. They should be completely  dry before you wear them in the collar.  If you are allowed to remove the collar for cleaning and bathing, wash and dry the skin of your neck. Check your skin for irritation or sores. If you see any, tell your health care provider.  Do not drive while wearing the collar.   Only take over-the-counter or prescription medicines for pain, discomfort, or fever as directed by your health care provider.   Keep all follow-up appointments as directed by your health care provider.   Keep all physical therapy appointments as directed by your health care provider.   Make any needed adjustments to your workstation to promote good posture.   Avoid positions and activities that make your symptoms worse.   Warm up and stretch before being active to help prevent problems.  SEEK MEDICAL CARE IF:   Your pain is not controlled with medicine.   You are unable to decrease your pain medicine over time as planned.   Your activity level is not improving as expected.  SEEK IMMEDIATE MEDICAL CARE IF:   You develop any bleeding.  You develop stomach upset.  You have signs of an allergic reaction to your medicine.   Your symptoms get worse.   You develop new, unexplained symptoms.   You have numbness, tingling, weakness, or paralysis in any part of your body.  MAKE SURE YOU:   Understand these instructions.  Will watch your condition.  Will get help right away if you are not doing well or get worse. Document Released: 12/24/2006 Document Revised: 03/03/2013 Document Reviewed: 09/03/2012 [facility] Patient Information 2015 [place], [place]. This information is not intended to replace advice given to you by your health care provider. Make sure you discuss any questions you have with your health care provider. Fall Prevention and Home Safety Falls cause injuries and can affect all age groups. It is possible to use preventive measures to significantly decrease the likelihood of falls. There  are many simple measures which can make your home safer and prevent falls. OUTDOORS  Repair cracks and edges of walkways and driveways.  Remove high doorway thresholds.  Trim shrubbery on the main path into your home.  Have good outside lighting.  Clear walkways of tools, rocks, debris, and clutter.  Check that handrails are not broken and are securely fastened. Both sides of steps should have handrails.  Have leaves, snow, and ice cleared regularly.  Use sand or salt on walkways during winter months.  In the garage, clean up grease or oil spills. BATHROOM  Install night lights.  Install grab bars by the toilet and in the tub and shower.  Use non-skid mats or decals in the tub or shower.  Place a plastic non-slip stool in the shower to sit on, if needed.  Keep floors dry and clean up all water on the floor immediately.  Remove soap buildup in the tub or shower on a regular basis.  Secure bath  mats with non-slip, double-sided rug tape.  Remove throw rugs and tripping hazards from the floors. BEDROOMS  Install night lights.  Make sure a bedside light is easy to reach.  Do not use oversized bedding.  Keep a telephone by your bedside.  Have a firm chair with side arms to use for getting dressed.  Remove throw rugs and tripping hazards from the floor. KITCHEN  Keep handles on pots and pans turned toward the center of the stove. Use back burners when possible.  Clean up spills quickly and allow time for drying.  Avoid walking on wet floors.  Avoid hot utensils and knives.  Position shelves so they are not too high or low.  Place commonly used objects within easy reach.  If necessary, use a sturdy step stool with a grab bar when reaching.  Keep electrical cables out of the way.  Do not use floor polish or wax that makes floors slippery. If you must use wax, use non-skid floor wax.  Remove throw rugs and tripping hazards from the  floor. STAIRWAYS  Never leave objects on stairs.  Place handrails on both sides of stairways and use them. Fix any loose handrails. Make sure handrails on both sides of the stairways are as long as the stairs.  Check carpeting to make sure it is firmly attached along stairs. Make repairs to worn or loose carpet promptly.  Avoid placing throw rugs at the top or bottom of stairways, or properly secure the rug with carpet tape to prevent slippage. Get rid of throw rugs, if possible.  Have an electrician put in a light switch at the top and bottom of the stairs. OTHER FALL PREVENTION TIPS  Wear low-heel or rubber-soled shoes that are supportive and fit well. Wear closed toe shoes.  When using a stepladder, make sure it is fully opened and both spreaders are firmly locked. Do not climb a closed stepladder.  Add color or contrast paint or tape to grab bars and handrails in your home. Place contrasting color strips on first and last steps.  Learn and use mobility aids as needed. Install an electrical emergency response system.  Turn on lights to avoid dark areas. Replace light bulbs that burn out immediately. Get light switches that glow.  Arrange furniture to create clear pathways. Keep furniture in the same place.  Firmly attach carpet with non-skid or double-sided tape.  Eliminate uneven floor surfaces.  Select a carpet pattern that does not visually hide the edge of steps.  Be aware of all pets. OTHER HOME SAFETY TIPS  Set the water temperature for 120 F (48.8 C).  Keep emergency numbers on or near the telephone.  Keep smoke detectors on every level of the home and near sleeping areas. Document Released: 02/16/2002 Document Revised: 08/28/2011 Document Reviewed: 05/18/2011 [facility] Patient Information 2015 [place], [place]. This information is not intended to replace advice given to you by your health care provider. Make sure you discuss any questions you have with your health  care provider.

## 2014-05-21 NOTE — ED Notes (Signed)
Pt reports fell out of bed this morning and hurt his neck on the left side. Also reports left leg pain. No LOC

## 2014-05-21 NOTE — ED Provider Notes (Signed)
CSN: [id number]     Arrival date & time 05/21/14  1339 History  This chart was scribed for non-physician practitioner working with No att. providers found by Angelene Giovanni, ED Scribe. The patient was seen in room TR07C/TR07C and the patient's care was started at 4:41 PM    Chief Complaint  Patient presents with  . Neck Pain   The history is provided by the patient. No language interpreter was used.   HPI Comments: Garrett Cole is a 44 y.o. male with a hx of chronic back pain who presents to the Emergency Department status post fall that occurred earlier today when his left leg gave out on him due to his restless leg syndrome. He explains that he landed against the dresser and tried to catch himself. He reports a 10/10 left sided neck pain. He denies hitting his head or LOC. He denies any acute numbness and weakness. He denies dizziness or lightheadedness.  He reports that he put icy hot on his neck but denies taking any OTC medication. He states that he does not take any daily medication for his pain that is why he has an appointment on the 16th to see a Neurologist.     Past Medical History  Diagnosis Date  . Tumor associated pain   . Hypertension    Past Surgical History  Procedure Laterality Date  . Leg surgery     History reviewed. No pertinent family history. History  Substance Use Topics  . Smoking status: Never Smoker   . Smokeless tobacco: Not on file  . Alcohol Use: No    Review of Systems  Constitutional: Negative for fever.  Musculoskeletal: Positive for arthralgias and neck pain.  Skin: Negative for rash and wound.  Neurological: Negative for dizziness, weakness, light-headedness, numbness and headaches.      Allergies  Hydrocodone; Ibuprofen; and Iodine  Home Medications   Prior to Admission medications   Medication Sig Start Date End Date Taking? Authorizing Provider  acetaminophen (TYLENOL) 500 MG tablet Take 1,000 mg by mouth every 6 (six) hours as  needed (pain).   Yes Historical Provider, MD  amLODipine (NORVASC) 10 MG tablet Take 10 mg by mouth daily.   Yes Historical Provider, MD  oxyCODONE-acetaminophen (PERCOCET/ROXICET) 5-325 MG per tablet Take 1 tablet by mouth every 6 (six) hours as needed for moderate pain or severe pain. 05/08/14  Yes Trixie Dredge, PA-C  predniSONE (DELTASONE) 10 MG tablet 6,5,4,3,2,1 taper Patient not taking: Reported on 03/05/2014 02/26/14   Elson Areas, PA-C   BP 124/82 mmHg  Pulse 90  Temp(Src) 98.8 F (37.1 C) (Oral)  Resp 18  Ht 6' (1.829 m)  Wt 170 lb (77.111 kg)  BMI 23.05 kg/m2  SpO2 99% Physical Exam  Constitutional: He is oriented to person, place, and time. He appears well-developed and well-nourished. No distress.  Nontoxic-appearing.  HENT:  Head: Normocephalic and atraumatic.  Right Ear: Tympanic membrane and external ear normal.  Left Ear: Tympanic membrane and external ear normal.  Nose: Nose normal.  Mouth/Throat: Oropharynx is clear and moist. No oropharyngeal exudate.  Eyes: Conjunctivae and EOM are normal. Pupils are equal, round, and reactive to light. Right eye exhibits no discharge. Left eye exhibits no discharge.  Neck: Normal range of motion. Neck supple. No JVD present. No tracheal deviation present.  Neck is mildly TTP over left trapezius. No midline tenderness. No edema or deformity. No crepitus or step offs. Full ROM of neck. No obvious injury.   Cardiovascular: Normal  rate, regular rhythm, normal heart sounds and intact distal pulses.   Pulmonary/Chest: Effort normal and breath sounds normal. No respiratory distress. He has no wheezes. He has no rales.  Abdominal: Soft. There is no tenderness.  Musculoskeletal: Normal range of motion.  Back is non tender to palpation.  Pt can turn his neck 45 degrees and upwards and downwards.  No bony point tenderness  Lymphadenopathy:    He has no cervical adenopathy.  Neurological: He is alert and oriented to person, place, and  time. He has normal reflexes. No cranial nerve deficit. Coordination normal.  Bilaterally patella DTRs intact.  Pt able to ambulate without assistance.  Skin: Skin is warm and dry. No rash noted. He is not diaphoretic. No erythema. No pallor.  Psychiatric: He has a normal mood and affect. His behavior is normal.  Nursing note and vitals reviewed.   ED Course  Procedures (including critical care time) DIAGNOSTIC STUDIES: Oxygen Saturation is 99% on RA, normal by my interpretation.    COORDINATION OF CARE: 4:48 PM- Pt advised of plan for treatment and pt agrees.    Labs Review Labs Reviewed - No data to display  Imaging Review No results found.   EKG Interpretation None      Filed Vitals:   05/21/14 1401  BP: 124/82  Pulse: 90  Temp: 98.8 F (37.1 C)  TempSrc: Oral  Resp: 18  Height: 6' (1.829 m)  Weight: 170 lb (77.111 kg)  SpO2: 99%     MDM   Meds given in ED:  Medications  ketorolac (TORADOL) injection 30 mg (30 mg Intramuscular Given 05/21/14 1659)    Discharge Medication List as of 05/21/2014  4:57 PM      Final diagnoses:  Fall, initial encounter  Neck pain on left side    This is a 44 year old male who presents the emergency department complaining of left lateral neck pain after falling while getting out of bed after his knee gave way this morning. Patient denies head injury or loss of consciousness. Toxic appearing. The patient's neck is mildly tender palpation over his left trapezius muscle. There is no bony point tenderness. There is no obvious injury or deformity. Patient has no focal neuro deficits.  I have reviewed records of multiple ED visits for similar or other pain-related complaints.Pt looked up on [place] Control Substance Database and had 90 tablets of Percocet dispensed on 05/13/14.  I feel that the patient's pain is chronic and cannot be appropriately or safely treated in emergency department setting. I do not feel that providing a prescription  for narcotic pain medicine is in this patient's best interest. I have urged patient to follow-up closely with their primary care physician or pain specialist. I have explicitly discussed with the patient on precautions and have reassured him that they can always be seen and evaluated in the emergency department for any condition that they feel is emergent, and that they will be given treatment as the emergency physician feels appropriate and safe, but this may not involve the use of narcotic pain medications. The patient was given the opportunity to voice any further questions or concerns and these were addressed to the best of my ability.  Pt looked up on [place] Control Substance Database and had 90 tablets of Percocet dispensed on 05/13/14. I explained that I'll be happy to treat his pain today but now with narcotic pain medicine. I advised the patient to follow-up with their primary care provider this week. I advised the  patient to return to the emergency department with new or worsening symptoms or new concerns. The patient verbalized understanding and agreement with plan.   I personally performed the services described in this documentation, which was scribed in my presence. The recorded information has been reviewed and is accurate.     Everlene FarrierWilliam Tuere Nwosu, PA-C 05/22/14 [id number]  Nelva Nayobert Beaton, MD 05/22/14 1056

## 2014-05-25 ENCOUNTER — Emergency Department (HOSPITAL_COMMUNITY)
Admission: EM | Admit: 2014-05-25 | Discharge: 2014-05-26 | Disposition: A | Discharge status: Home / Self Care | Payer: Medicaid - Out of State | Attending: Emergency Medicine | Admitting: Emergency Medicine

## 2014-05-25 ENCOUNTER — Encounter (HOSPITAL_COMMUNITY): Payer: Self-pay | Admitting: Adult Health

## 2014-05-25 DIAGNOSIS — G8929 Other chronic pain: Secondary | ICD-10-CM | POA: Insufficient documentation

## 2014-05-25 DIAGNOSIS — I10 Essential (primary) hypertension: Secondary | ICD-10-CM | POA: Insufficient documentation

## 2014-05-25 DIAGNOSIS — R1031 Right lower quadrant pain: Secondary | ICD-10-CM

## 2014-05-25 DIAGNOSIS — Z79899 Other long term (current) drug therapy: Secondary | ICD-10-CM | POA: Insufficient documentation

## 2014-05-25 DIAGNOSIS — N50819 Testicular pain, unspecified: Secondary | ICD-10-CM

## 2014-05-25 DIAGNOSIS — M549 Dorsalgia, unspecified: Secondary | ICD-10-CM | POA: Diagnosis present

## 2014-05-25 DIAGNOSIS — Z8739 Personal history of other diseases of the musculoskeletal system and connective tissue: Secondary | ICD-10-CM | POA: Insufficient documentation

## 2014-05-25 DIAGNOSIS — K222 Esophageal obstruction: Secondary | ICD-10-CM | POA: Insufficient documentation

## 2014-05-25 HISTORY — DX: Other chronic pain: G89.29

## 2014-05-25 HISTORY — DX: Dorsalgia, unspecified: M54.9

## 2014-05-25 HISTORY — DX: Radiculopathy, lumbar region: M54.16

## 2014-05-25 LAB — URINALYSIS, ROUTINE W REFLEX MICROSCOPIC
Glucose, UA: NEGATIVE mg/dL
HGB URINE DIPSTICK: NEGATIVE
Ketones, ur: 15 mg/dL — AB
Leukocytes, UA: NEGATIVE
NITRITE: NEGATIVE
PROTEIN: NEGATIVE mg/dL
Specific Gravity, Urine: 1.028 (ref 1.005–1.030)
UROBILINOGEN UA: 1 mg/dL (ref 0.0–1.0)
pH: 5.5 (ref 5.0–8.0)

## 2014-05-25 MED ORDER — OXYCODONE-ACETAMINOPHEN 5-325 MG PO TABS
2.0000 | ORAL_TABLET | Freq: Once | ORAL | Status: AC
  Administered 2014-05-25: 2 via ORAL
  Filled 2014-05-25: qty 2

## 2014-05-25 NOTE — ED Provider Notes (Signed)
CSN: [id number]     Arrival date & time 05/25/14  1901 History   First MD Initiated Contact with Patient 05/25/14 2220     Chief Complaint  Patient presents with  . Back Pain  . Groin Pain      HPI  Pt was seen at 2235.  Pt c/o gradual onset and persistence of constant right groin "pain" for the past several days. Pt describes the pain as "sharp" with radiation into his right testicle. Denies dysuria/hematuria, no penile discharge/rash, no injury, no fevers, no N/V/D, no abd pain. Pt also c/o gradual onset and persistence of constant acute flair of his chronic low back "pain" for the past several years.  Denies any change in his usual chronic pain pattern.  Pain worsens with palpation of the area and body position changes. Denies incont/retention of bowel or bladder, no saddle anesthesia, no focal motor weakness, no tingling/numbness in extremities, no fevers, no injury, no abd pain. The patient has a significant history of similar symptoms previously, recently being evaluated for this complaint and multiple prior evals for same.    Past Medical History  Diagnosis Date  . 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 Hypertension   . Chronic back pain   . Lumbar radiculopathy    Past Surgical History  Procedure Laterality Date  . Leg surgery      History  Substance Use Topics  . Smoking status: Never Smoker   . Smokeless tobacco: Not on file  . Alcohol Use: No    Review of Systems ROS: Statement: All systems negative except as marked or noted in the HPI; Constitutional: Negative for fever and chills. ; ; Eyes: Negative for eye pain, redness and discharge. ; ; ENMT: Negative for ear pain, hoarseness, nasal congestion, sinus pressure and sore throat. ; ; Cardiovascular: Negative for chest pain, palpitations, diaphoresis, dyspnea and peripheral edema. ; ; Respiratory: Negative for cough, wheezing and stridor. ; ; Gastrointestinal: Negative for nausea, vomiting,  diarrhea, abdominal pain, blood in stool, hematemesis, jaundice and rectal bleeding. . ; ; Genitourinary: Negative for dysuria, flank pain and hematuria. ; ; Musculoskeletal: Negative for back pain and neck pain. Negative for swelling and trauma.; ; Skin: Negative for pruritus, rash, abrasions, blisters, bruising and skin lesion.; ; Neuro: Negative for headache, lightheadedness and neck stiffness. Negative for weakness, altered level of consciousness , altered mental status, extremity weakness, paresthesias, involuntary movement, seizure and syncope.     Allergies  Hydrocodone; Ibuprofen; and Iodine  Home Medications   Prior to Admission medications   Medication Sig Start Date End Date Taking? Authorizing Provider  acetaminophen (TYLENOL) 500 MG tablet Take 1,000 mg by mouth every 6 (six) hours as needed (pain).   Yes Historical Provider, MD  amLODipine (NORVASC) 10 MG tablet Take 10 mg by mouth daily.   Yes Historical Provider, MD  oxyCODONE-acetaminophen (PERCOCET/ROXICET) 5-325 MG per tablet Take 1 tablet by mouth every 6 (six) hours as needed for moderate pain or severe pain. Patient not taking: Reported on 05/25/2014 05/08/14   Trixie DredgeEmily West, PA-C  predniSONE (DELTASONE) 10 MG tablet 6,5,4,3,2,1 taper Patient not taking: Reported on 03/05/2014 02/26/14   Elson AreasLeslie K Sofia, PA-C   BP 126/78 mmHg  Pulse 86  Temp(Src) 99.4 F (37.4 C) (Oral)  Resp 16  Ht 6\' 4"  (1.93 m)  Wt 160 lb 8 oz (72.802 kg)  BMI 19.54 kg/m2  SpO2 100% Physical Exam 2240: Physical examination:  Nursing notes  reviewed; Vital signs and O2 SAT reviewed;  Constitutional: Well developed, Well nourished, Well hydrated, In no acute distress; Head:  Normocephalic, atraumatic; Eyes: EOMI, PERRL, No scleral icterus; ENMT: Mouth and pharynx normal, Mucous membranes moist; Neck: Supple, Full range of motion, No lymphadenopathy; Cardiovascular: Regular rate and rhythm, No murmur, rub, or gallop; Respiratory: Breath sounds clear & equal  bilaterally, No rales, rhonchi, wheezes.  Speaking full sentences with ease, Normal respiratory effort/excursion; Chest: Nontender, Movement normal; Abdomen: Soft, Nontender, Nondistended, Normal bowel sounds; Genitourinary: No CVA tenderness. Genital exam performed with pt permission and male ED RN chaperone present during exam.  No perineal erythema.  No penile lesions or drainage.  No scrotal erythema or edema.  Normal testicular lie.  +right proximal posterior testicular tenderness to palp. No testicular edema. NT left testicle. +cremasteric reflexes bilat.  No inguinal LAN or palpable masses.;; Spine:  No midline CS, TS, LS tenderness. +TTP lumbar paraspinal muscles.;; Extremities: Pulses normal, No tenderness, No edema, No calf edema or asymmetry.; Neuro: AA&Ox3, Major CN grossly intact.  Speech clear. No gross focal motor or sensory deficits in extremities. Strength 5/5 equal bilat UE's and LE's, including great toe dorsiflexion.  DTR 2/4 equal bilat UE's and LE's.  No gross sensory deficits.  Neg straight leg raises bilat.; Skin: Color normal, Warm, Dry.   ED Course  Procedures     2245:  Pt requesting "some pain meds" multiple times during my evaluation. [place] Controlled Substance Database accessed:  In the past 3 months, pt has filled 6 different narcotic pain medication prescriptions, prescribed by 4 different doctors and filled at 2 different pharmacies. Pt filled oxycodone  tabs #90, on 05/13/14.  Concerned regarding this information. Will not rx further narcotic pain medications. Pt informed; verb understanding.   0125:  [place] and CT pending. Sign out to Dr. Norlene Campbell.    MDM  MDM Reviewed: previous chart, nursing note and vitals Reviewed previous: MRI Interpretation: labs   MRI LS: IMPRESSION: 1. No evidence for cord compression or significant central canal stenosis. No nerve root compression identified. 2. 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 3. No significant interval change in multi cystic lesion involving the L5 vertebral body. This lesion is indeterminate, but most likely benign given its relative stability from 2011.  Electronically Signed  By: Rise Mu M.D.  On: 04/19/2014 23:24     Results for orders placed or performed during the hospital encounter of 05/25/14  Urinalysis, Routine w reflex microscopic  Result Value Ref Range   Color, Urine AMBER (A) YELLOW   APPearance CLEAR CLEAR   Specific Gravity, Urine 1.028 1.005 - 1.030   pH 5.5 5.0 - 8.0   Glucose, UA NEGATIVE NEGATIVE mg/dL   Hgb urine dipstick NEGATIVE NEGATIVE   Bilirubin Urine SMALL (A) NEGATIVE   Ketones, ur 15 (A) NEGATIVE mg/dL   Protein, ur NEGATIVE NEGATIVE mg/dL   Urobilinogen, UA 1.0 0.0 - 1.0 mg/dL   Nitrite NEGATIVE NEGATIVE   Leukocytes, UA NEGATIVE NEGATIVE          Samuel Jester, DO 05/26/14 0127

## 2014-05-25 NOTE — ED Notes (Signed)
Presents with chronic lower back pain-has an appointment to see the neurosurgeon- he also endorses a few days of groin pain radiating into testicles and rectal pain worse with bowel movements. He denies penile discharge, swelling and is requesting his MRI copy he had a month ago of his back.  Pain is described as sharp.

## 2014-05-26 ENCOUNTER — Emergency Department (HOSPITAL_COMMUNITY): Payer: Medicaid - Out of State

## 2014-05-26 ENCOUNTER — Encounter (HOSPITAL_COMMUNITY): Payer: Self-pay

## 2014-05-26 LAB — GC/CHLAMYDIA PROBE AMP (~~LOC~~) NOT AT ARMC
CHLAMYDIA, DNA PROBE: NEGATIVE
NEISSERIA GONORRHEA: NEGATIVE

## 2014-05-26 NOTE — ED Notes (Signed)
Patient to US

## 2014-05-26 NOTE — Discharge Instructions (Signed)
Your ultrasound today showed no signs of abnormalities.  Your CAT scan showed stones in your right kidney but no stones outside of kidney causing problems.  It was noted that your lower esophagus appears to have a stricture.  You need to have further follow-up with gastroenterology to evaluate this finding.     Chronic Back Pain  When back pain lasts longer than 3 months, it is called chronic back pain.People with chronic back pain often go through certain periods that are more intense (flare-ups).  CAUSES Chronic back pain can be caused by wear and tear (degeneration) on different structures in your back. These structures include:  The bones of your spine (vertebrae) and the joints surrounding your spinal cord and nerve roots (facets).  The strong, fibrous tissues that connect your vertebrae (ligaments). Degeneration of these structures may result in pressure on your nerves. This can lead to constant pain. HOME CARE INSTRUCTIONS  Avoid bending, heavy lifting, prolonged sitting, and activities which make the problem worse.  Take brief periods of rest throughout the day to reduce your pain. Lying down or standing usually is better than sitting while you are resting.  Take over-the-counter or prescription medicines only as directed by your caregiver. SEEK IMMEDIATE MEDICAL CARE IF:   You have weakness or numbness in one of your legs or feet.  You have trouble controlling your bladder or bowels.  You have nausea, vomiting, abdominal pain, shortness of breath, or fainting. Document Released: 04/05/2004 Document Revised: 05/21/2011 Document Reviewed: 02/10/2011 [facility] Patient Information 2015 [place], [place]. This information is not intended to replace advice given to you by your health care provider. Make sure you discuss any questions you have with your health care provider.  Esophageal Stricture The esophagus is the long, narrow tube which carries food and liquid from the mouth to the  stomach. Sometimes a part of the esophagus becomes narrow and makes it difficult, painful, or even impossible to swallow. This is called an esophageal stricture.  CAUSES  Common causes of blockage or strictures of the esophagus are:  Exposure of the lower esophagus to the acid from the stomach may cause narrowing.  Hiatal hernia in which a small part of the stomach bulges up through the diaphragm can cause a narrowing in the bottom of the esophagus.  Scleroderma is a tissue disorder that affects the esophagus and makes swallowing difficult.  Achalasia is an absence of nerves in the lower esophagus and to the esophageal sphincter. This absence of nerves may be congenital (present since birth). This can cause irregular spasms which do not allow food and fluid through.  Strictures may develop from swallowing materials which damage the esophagus. Examples are acids or alkalis such as lye.  Schatzki's Ring is a narrow ring of non-cancerous tissue which narrows the lower esophagus. The cause of this is unknown.  Growths can block the esophagus. SYMPTOMS  Some of the problems are difficulty swallowing or pain with swallowing. DIAGNOSIS  Your caregiver often suspects this problem by taking a medical history. They will also do a physical exam. They may then take X-rays and/or perform an endoscopy. Endoscopy is an exam in which a tube like a small flexible telescope is used to look at your esophagus.  TREATMENT  One form of treatment is to dilate the narrow area. This means to stretch it.  When this is not successful, chest surgery may be required. This is a much more extensive form of treatment with a longer recovery time. Both of  the above treatments make the passage of food and water into the stomach easier. They also make it easier for stomach contents to bubble back into the esophagus. Special medications may be used following the procedure to help prevent further narrowing. Medications may be  used to lower the amount of acid in the stomach juice.  SEEK IMMEDIATE MEDICAL CARE IF:   Your swallowing is becoming more painful, difficult, or you are unable to swallow.  You vomit up blood.  You develop black tarry stools.  You develop chills.  You have a fever.  You develop chest or abdominal pain.  You develop shortness of breath, feel lightheaded, or faint. Follow up with medical care as your caregiver suggests. Document Released: 11/06/2005 Document Revised: 05/21/2011 Document Reviewed: 07/08/2013 [facility] Patient Information 2015 [place]. This information is not intended to replace advice given to you by your health care provider. Make sure you discuss any questions you have with your health care provider.  Pain of Unknown Etiology (Pain Without a Known Cause) You have come to your caregiver because of pain. Pain can occur in any part of the body. Often there is not a definite cause. If your laboratory (blood or urine) work was normal and X-rays or other studies were normal, your caregiver may treat you without knowing the cause of the pain. An example of this is the headache. Most headaches are diagnosed by taking a history. This means your caregiver asks you questions about your headaches. Your caregiver determines a treatment based on your answers. Usually testing done for headaches is normal. Often testing is not done unless there is no response to medications. Regardless of where your pain is located today, you can be given medications to make you comfortable. If no physical cause of pain can be found, most cases of pain will gradually leave as suddenly as they came.  If you have a painful condition and no reason can be found for the pain, it is important that you follow up with your caregiver. If the pain becomes worse or does not go away, it may be necessary to repeat tests and look further for a possible cause.  Only take over-the-counter or prescription medicines  for pain, discomfort, or fever as directed by your caregiver.  For the protection of your privacy, test results cannot be given over the phone. Make sure you receive the results of your test. Ask how these results are to be obtained if you have not been informed. It is your responsibility to obtain your test results.  You may continue all activities unless the activities cause more pain. When the pain lessens, it is important to gradually resume normal activities. Resume activities by beginning slowly and gradually increasing the intensity and duration of the activities or exercise. During periods of severe pain, bed rest may be helpful. Lie or sit in any position that is comfortable.  Ice used for acute (sudden) conditions may be effective. Use a large plastic bag filled with ice and wrapped in a towel. This may provide pain relief.  See your caregiver for continued problems. Your caregiver can help or refer you for exercises or physical therapy if necessary. If you were given medications for your condition, do not drive, operate machinery or power tools, or sign legal documents for 24 hours. Do not drink alcohol, take sleeping pills, or take other medications that may interfere with treatment. See your caregiver immediately if you have pain that is becoming worse and not relieved by  medications. Document Released: 11/21/2000 Document Revised: 12/17/2012 Document Reviewed: 02/26/2005 [facility] Patient Information 2015 [place], [place]. This information is not intended to replace advice given to you by your health care provider. Make sure you discuss any questions you have with your health care provider.

## 2014-05-26 NOTE — ED Notes (Signed)
Patient returned from US.

## 2014-05-26 NOTE — ED Provider Notes (Signed)
Care assumed from Dr. Clarene Duke at change of shift.  Patient with right groin pain with radiation into his right testicle, has history of chronic back pain.  Awaiting CT noncontrast abdomen pelvis and ultrasound of scrotum.  Signs of esophageal stricture noted, will have patient follow-up with GI.  Results for orders placed or performed during the hospital encounter of 05/25/14  Urinalysis, Routine w reflex microscopic  Result Value Ref Range   Color, Urine AMBER (A) YELLOW   APPearance CLEAR CLEAR   Specific Gravity, Urine 1.028 1.005 - 1.030   pH 5.5 5.0 - 8.0   Glucose, UA NEGATIVE NEGATIVE mg/dL   Hgb urine dipstick NEGATIVE NEGATIVE   Bilirubin Urine SMALL (A) NEGATIVE   Ketones, ur 15 (A) NEGATIVE mg/dL   Protein, ur NEGATIVE NEGATIVE mg/dL   Urobilinogen, UA 1.0 0.0 - 1.0 mg/dL   Nitrite NEGATIVE NEGATIVE   Leukocytes, UA NEGATIVE NEGATIVE   Ct Abdomen Pelvis Wo Contrast  05/26/2014   CLINICAL DATA:  Right groin pain for 2-3 days appear  EXAM: CT ABDOMEN AND PELVIS WITHOUT CONTRAST  TECHNIQUE: Multidetector CT imaging of the abdomen and pelvis was performed following the standard protocol without IV contrast.  COMPARISON:  MRI 04/19/2014, scrotal ultrasound 05/26/2014  FINDINGS: There is marked dilatation and mural thickening of the distal esophagus with prominent retention of air and fluid. A distal esophageal stricture cannot be excluded. No mass is evident.  There are unremarkable unenhanced appearances of the liver, pancreas, and adrenals. The spleen is remarkable only for a 1.8 cm low-attenuation focus inferiorly which may represent a cyst but it is not conclusively characterized. There is a 1-2 mm midpole collecting system calculus in the right kidney. There are otherwise normal appearances of the kidneys. There is no hydronephrosis. There is no ureteral dilatation. There is no ureteral calculus. The appendix is normal. Small bowel and colon are unremarkable in appearance. Stomach is  nondistended, grossly unremarkable. No acute inflammatory changes are evident in the abdomen or pelvis. There is no adenopathy. There is no ascites. The abdominal aorta is normal in caliber.  The known of bone lesion at L5 on the left is unchanged, with benign appearances.  IMPRESSION: 1. Marked dilatation and mural thickening of the distal esophagus, extending superiorly above the upper limits of this examination. Achalasia or distal esophageal stricture may be considered. No masses visible. 2. No acute findings are evident in the abdomen or pelvis. 3. Right nephrolithiasis. No hydronephrosis, ureteral calculus or ureteral dilatation. 4. Normal appendix   Electronically Signed   By: Ellery Plunk M.D.   On: 05/26/2014 02:06   Dg Lumbar Spine Complete  04/30/2014   CLINICAL DATA:  Larey Seat in shower today. Initial encounter. Back pain.  EXAM: LUMBAR SPINE - COMPLETE 4+ VIEW  COMPARISON:  Most recent MRI 04/19/2014. Prior plain films 09/23/2009.  FINDINGS: There are apparent lucencies overlying the L4 and L5 vertebral bodies which are believed to represent bowel gas.  No compression fracture or traumatic subluxation. No significant facet disease or pars defects. Intervertebral disc spaces are preserved. No pelvic lesions.  Similar appearance to prior lumbar spine plain films and prior MR.  No clear-cut progression of the known lesion in the L5 vertebral body compared with prior imaging.  IMPRESSION: No definite posttraumatic sequelae.  See discussion above   Electronically Signed   By: Davonna Belling M.D.   On: 04/30/2014 14:55   Dg Sacrum/coccyx  04/30/2014   CLINICAL DATA:  Low back pain.  EXAM: SACRUM  AND COCCYX - 2+ VIEW  COMPARISON:  None.  FINDINGS: There is no evidence of fracture or other focal bone lesions.  IMPRESSION: Negative.   Electronically Signed   By: Signa Kellaylor  Stroud M.D.   On: 04/30/2014 14:54   [place] Scrotum  05/26/2014   CLINICAL DATA:  Acute onset of right stimulator pain and lower back pain,  radiating to the right groin. Initial encounter.  EXAM: SCROTAL ULTRASOUND  DOPPLER ULTRASOUND OF THE TESTICLES  TECHNIQUE: Complete ultrasound examination of the testicles, epididymis, and other scrotal structures was performed. Color and spectral Doppler ultrasound were also utilized to evaluate blood flow to the testicles.  COMPARISON:  None.  FINDINGS: Right testicle  Measurements: 4.3 x 2.0 x 3.4 cm. No mass or microlithiasis visualized.  Left testicle  Measurements: 4.3 x 2.0 x 3.3 cm. No mass or microlithiasis visualized.  Right epididymis:  Normal in size and appearance.  Left epididymis:  Normal in size and appearance.  Hydrocele:  None visualized.  Varicocele:  None visualized.  Pulsed Doppler interrogation of both testes demonstrates normal low resistance arterial and venous waveforms bilaterally.  IMPRESSION: Unremarkable scrotal ultrasound. No evidence for testicular torsion.   Electronically Signed   By: Roanna RaiderJeffery  Chang M.D.   On: 05/26/2014 01:42   [place] Art/ven Flow Abd Pelv Doppler  05/26/2014   CLINICAL DATA:  Acute onset of right stimulator pain and lower back pain, radiating to the right groin. Initial encounter.  EXAM: SCROTAL ULTRASOUND  DOPPLER ULTRASOUND OF THE TESTICLES  TECHNIQUE: Complete ultrasound examination of the testicles, epididymis, and other scrotal structures was performed. Color and spectral Doppler ultrasound were also utilized to evaluate blood flow to the testicles.  COMPARISON:  None.  FINDINGS: Right testicle  Measurements: 4.3 x 2.0 x 3.4 cm. No mass or microlithiasis visualized.  Left testicle  Measurements: 4.3 x 2.0 x 3.3 cm. No mass or microlithiasis visualized.  Right epididymis:  Normal in size and appearance.  Left epididymis:  Normal in size and appearance.  Hydrocele:  None visualized.  Varicocele:  None visualized.  Pulsed Doppler interrogation of both testes demonstrates normal low resistance arterial and venous waveforms bilaterally.  IMPRESSION: Unremarkable  scrotal ultrasound. No evidence for testicular torsion.   Electronically Signed   By: Roanna RaiderJeffery  Chang M.D.   On: 05/26/2014 01:42      Marisa Severinlga Boluwatife Mutchler, MD 05/26/14 [phone]

## 2014-05-27 ENCOUNTER — Emergency Department (HOSPITAL_COMMUNITY)
Admission: EM | Admit: 2014-05-27 | Discharge: 2014-05-27 | Disposition: A | Discharge status: Home / Self Care | Payer: No Typology Code available for payment source | Attending: Emergency Medicine | Admitting: Emergency Medicine

## 2014-05-27 ENCOUNTER — Encounter (HOSPITAL_COMMUNITY): Payer: Self-pay | Admitting: Emergency Medicine

## 2014-05-27 DIAGNOSIS — M545 Low back pain: Secondary | ICD-10-CM | POA: Diagnosis present

## 2014-05-27 DIAGNOSIS — Z9114 Patient's other noncompliance with medication regimen: Secondary | ICD-10-CM | POA: Insufficient documentation

## 2014-05-27 DIAGNOSIS — Y9389 Activity, other specified: Secondary | ICD-10-CM | POA: Diagnosis not present

## 2014-05-27 DIAGNOSIS — X58XXXA Exposure to other specified factors, initial encounter: Secondary | ICD-10-CM | POA: Insufficient documentation

## 2014-05-27 DIAGNOSIS — G8929 Other chronic pain: Secondary | ICD-10-CM | POA: Insufficient documentation

## 2014-05-27 DIAGNOSIS — Y9289 Other specified places as the place of occurrence of the external cause: Secondary | ICD-10-CM | POA: Insufficient documentation

## 2014-05-27 DIAGNOSIS — Y998 Other external cause status: Secondary | ICD-10-CM | POA: Diagnosis not present

## 2014-05-27 DIAGNOSIS — M549 Dorsalgia, unspecified: Secondary | ICD-10-CM | POA: Insufficient documentation

## 2014-05-27 DIAGNOSIS — Z79899 Other long term (current) drug therapy: Secondary | ICD-10-CM | POA: Diagnosis not present

## 2014-05-27 DIAGNOSIS — I10 Essential (primary) hypertension: Secondary | ICD-10-CM | POA: Insufficient documentation

## 2014-05-27 DIAGNOSIS — S30810A Abrasion of lower back and pelvis, initial encounter: Secondary | ICD-10-CM | POA: Insufficient documentation

## 2014-05-27 LAB — COMPREHENSIVE METABOLIC PANEL
ALT: 9 U/L (ref 0–53)
AST: 14 U/L (ref 0–37)
Albumin: 3.8 g/dL (ref 3.5–5.2)
Alkaline Phosphatase: 83 U/L (ref 39–117)
Anion gap: 8 (ref 5–15)
BUN: 6 mg/dL (ref 6–23)
CO2: 27 mmol/L (ref 19–32)
Calcium: 9.1 mg/dL (ref 8.4–10.5)
Chloride: 104 mmol/L (ref 96–112)
Creatinine, Ser: 1.16 mg/dL (ref 0.50–1.35)
GFR calc Af Amer: 86 mL/min — ABNORMAL LOW (ref >90)
GFR, EST NON AFRICAN AMERICAN: 75 mL/min — AB (ref >90)
Glucose, Bld: 102 mg/dL — ABNORMAL HIGH (ref 70–99)
POTASSIUM: 3.4 mmol/L — AB (ref 3.5–5.1)
SODIUM: 139 mmol/L (ref 135–145)
TOTAL PROTEIN: 6.6 g/dL (ref 6.0–8.3)
Total Bilirubin: 0.9 mg/dL (ref 0.3–1.2)

## 2014-05-27 LAB — CBC WITH DIFFERENTIAL/PLATELET
Basophils Absolute: 0.1 10*3/uL (ref 0.0–0.1)
Basophils Relative: 1 % (ref 0–1)
EOS PCT: 1 % (ref 0–5)
Eosinophils Absolute: 0.1 10*3/uL (ref 0.0–0.7)
HCT: 41.7 % (ref 39.0–52.0)
HEMOGLOBIN: 13.7 g/dL (ref 13.0–17.0)
LYMPHS ABS: 1.6 10*3/uL (ref 0.7–4.0)
Lymphocytes Relative: 16 % (ref 12–46)
MCH: 29 pg (ref 26.0–34.0)
MCHC: 32.9 g/dL (ref 30.0–36.0)
MCV: 88.3 fL (ref 78.0–100.0)
MONO ABS: 0.7 10*3/uL (ref 0.1–1.0)
MONOS PCT: 8 % (ref 3–12)
NEUTROS ABS: 7.3 10*3/uL (ref 1.7–7.7)
Neutrophils Relative %: 74 % (ref 43–77)
Platelets: 289 10*3/uL (ref 150–400)
RBC: 4.72 MIL/uL (ref 4.22–5.81)
RDW: 14.4 % (ref 11.5–15.5)
WBC: 9.7 10*3/uL (ref 4.0–10.5)

## 2014-05-27 LAB — POC OCCULT BLOOD, ED: Fecal Occult Bld: NEGATIVE

## 2014-05-27 MED ORDER — ACETAMINOPHEN 325 MG PO TABS
650.0000 mg | ORAL_TABLET | Freq: Once | ORAL | Status: AC
  Administered 2014-05-27: 650 mg via ORAL
  Filled 2014-05-27: qty 2

## 2014-05-27 NOTE — ED Provider Notes (Signed)
CSN: [id number]     Arrival date & time 05/27/14  1220 History   First MD Initiated Contact with Patient 05/27/14 1736     Chief Complaint  Patient presents with  . Back Pain  . Rectal Bleeding     (Consider location/radiation/quality/duration/timing/severity/associated sxs/prior Treatment) HPI   Garrett Cole is a 44 y.o. male who presents for evaluation of rectal bleeding. He has had a single episode of bleeding which occurred after passing a large bowel movement, this morning. He was in the emergency department 2 days ago, at that time complaining of abdominal pain and left scrotal pain./Scrotum pain has improved. He has been able to eat and drink. He is interested in seeing a doctor about his back, and plans on seeing a Careers adviser, in [place]. 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 He denies bowel incontinence, urinary incontinence, saddle anesthesia, gait instability, fever, nausea or vomiting. He is hungry at this time. There are no other known modifying factors.   Past Medical History  Diagnosis Date  . 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 Hypertension   . Chronic back pain   . Lumbar radiculopathy    Past Surgical History  Procedure Laterality Date  . Leg surgery     History reviewed. No pertinent family history. History  Substance Use Topics  . Smoking status: Never Smoker   . Smokeless tobacco: Not on file  . Alcohol Use: No    Review of Systems  All other systems reviewed and are negative.     Allergies  Hydrocodone; Ibuprofen; and Iodine  Home Medications   Prior to Admission medications   Medication Sig Start Date End Date Taking? Authorizing Provider  acetaminophen (TYLENOL) 500 MG tablet Take 1,000 mg by mouth every 6 (six) hours as needed (pain).   Yes Historical Provider, MD  amLODipine (NORVASC) 10 MG tablet Take 10 mg by mouth daily.   Yes Historical Provider, MD   oxyCODONE-acetaminophen (PERCOCET/ROXICET) 5-325 MG per tablet Take 1 tablet by mouth every 6 (six) hours as needed for moderate pain or severe pain. Patient not taking: Reported on 05/25/2014 05/08/14   Trixie Dredge, PA-C  predniSONE (DELTASONE) 10 MG tablet 6,5,4,3,2,1 taper Patient not taking: Reported on 03/05/2014 02/26/14   Elson Areas, PA-C   BP 121/83 mmHg  Pulse 104  Temp(Src) 98.5 F (36.9 C) (Oral)  Resp 17  SpO2 100% Physical Exam  Constitutional: He is oriented to person, place, and time. He appears well-developed and well-nourished.  HENT:  Head: Normocephalic and atraumatic.  Right Ear: External ear normal.  Left Ear: External ear normal.  Eyes: Conjunctivae and EOM are normal. Pupils are equal, round, and reactive to light.  Neck: Normal range of motion and phonation normal. Neck supple.  Cardiovascular: Normal rate, regular rhythm and normal heart sounds.   Pulmonary/Chest: Effort normal and breath sounds normal. He exhibits no bony tenderness.  Abdominal: Soft. There is no tenderness.  Genitourinary:  Normal anal sphincter tone. Small amount of brown stool in the rectal vault. No rectal mass or fecal impaction. About 2 cm posterior to the anus, in the midline, there is a 0.5 x 0.25 cm superficial abrasion that could easily be a site of recent bleeding. It is not actively bleeding, draining or have associated fluctuance.  Musculoskeletal: Normal range of motion.  Neurological: He is alert and oriented to person, place, and time. No cranial nerve  deficit or sensory deficit. He exhibits normal muscle tone. Coordination normal.  Skin: Skin is warm, dry and intact.  Psychiatric: He has a normal mood and affect. His behavior is normal. Judgment and thought content normal.  Nursing note and vitals reviewed.   ED Course  Procedures (including critical care time)  At the time of evaluation, the patient requested "pain medicine". Review of the [place] Controlled  substance reporting system databank, indicates that he gets chronic pain medication, oxycodone 5 mg #90, every month by Dr. Modena SlaterEnrico. His last prescription for oxycodone was on 05/13/2014; he should have 48, left  today.    Medications  acetaminophen (TYLENOL) tablet 650 mg (not administered)    Patient Vitals for the past 24 hrs:  BP Temp Temp src Pulse Resp SpO2  05/27/14 1740 126/72 mmHg - - 100 16 98 %  05/27/14 1403 121/83 mmHg 98.5 F (36.9 C) Oral 104 17 100 %  05/27/14 1235 125/86 mmHg 97.5 F (36.4 C) Oral 102 18 100 %    8:12 PM Reevaluation with update and discussion. After initial assessment and treatment, an updated evaluation reveals he is able to eat. He states he is still uncomfortable. He informed me that he was out of his oxycodone, because he had "doubled up on it". He states that he no longer has a chronic pain doctor. Cecile Guevara L     Labs Review Labs Reviewed  COMPREHENSIVE METABOLIC PANEL - Abnormal; Notable for the following:    Potassium 3.4 (*)    Glucose, Bld 102 (*)    GFR calc non Af Amer 75 (*)    GFR calc Af Amer 86 (*)    All other components within normal limits  CBC WITH DIFFERENTIAL/PLATELET  POC OCCULT BLOOD, ED    Imaging Review Ct Abdomen Pelvis Wo Contrast  05/26/2014   CLINICAL DATA:  Right groin pain for 2-3 days appear  EXAM: CT ABDOMEN AND PELVIS WITHOUT CONTRAST  TECHNIQUE: Multidetector CT imaging of the abdomen and pelvis was performed following the standard protocol without IV contrast.  COMPARISON:  MRI 04/19/2014, scrotal ultrasound 05/26/2014  FINDINGS: There is marked dilatation and mural thickening of the distal esophagus with prominent retention of air and fluid. A distal esophageal stricture cannot be excluded. No mass is evident.  There are unremarkable unenhanced appearances of the liver, pancreas, and adrenals. The spleen is remarkable only for a 1.8 cm low-attenuation focus inferiorly which may represent a cyst but it is  not conclusively characterized. There is a 1-2 mm midpole collecting system calculus in the right kidney. There are otherwise normal appearances of the kidneys. There is no hydronephrosis. There is no ureteral dilatation. There is no ureteral calculus. The appendix is normal. Small bowel and colon are unremarkable in appearance. Stomach is nondistended, grossly unremarkable. No acute inflammatory changes are evident in the abdomen or pelvis. There is no adenopathy. There is no ascites. The abdominal aorta is normal in caliber.  The known of bone lesion at L5 on the left is unchanged, with benign appearances.  IMPRESSION: 1. Marked dilatation and mural thickening of the distal esophagus, extending superiorly above the upper limits of this examination. Achalasia or distal esophageal stricture may be considered. No masses visible. 2. No acute findings are evident in the abdomen or pelvis. 3. Right nephrolithiasis. No hydronephrosis, ureteral calculus or ureteral dilatation. 4. Normal appendix   Electronically Signed   By: Ellery Plunkaniel R Mitchell M.D.   On: 05/26/2014 02:06   [place] Scrotum  05/26/2014   CLINICAL DATA:  Acute onset of right stimulator pain and lower back pain, radiating to the right groin. Initial encounter.  EXAM: SCROTAL ULTRASOUND  DOPPLER ULTRASOUND OF THE TESTICLES  TECHNIQUE: Complete ultrasound examination of the testicles, epididymis, and other scrotal structures was performed. Color and spectral Doppler ultrasound were also utilized to evaluate blood flow to the testicles.  COMPARISON:  None.  FINDINGS: Right testicle  Measurements: 4.3 x 2.0 x 3.4 cm. No mass or microlithiasis visualized.  Left testicle  Measurements: 4.3 x 2.0 x 3.3 cm. No mass or microlithiasis visualized.  Right epididymis:  Normal in size and appearance.  Left epididymis:  Normal in size and appearance.  Hydrocele:  None visualized.  Varicocele:  None visualized.  Pulsed Doppler interrogation of both testes demonstrates normal  low resistance arterial and venous waveforms bilaterally.  IMPRESSION: Unremarkable scrotal ultrasound. No evidence for testicular torsion.   Electronically Signed   By: Roanna Raider M.D.   On: 05/26/2014 01:42   [place] Art/ven Flow Abd Pelv Doppler  05/26/2014   CLINICAL DATA:  Acute onset of right stimulator pain and lower back pain, radiating to the right groin. Initial encounter.  EXAM: SCROTAL ULTRASOUND  DOPPLER ULTRASOUND OF THE TESTICLES  TECHNIQUE: Complete ultrasound examination of the testicles, epididymis, and other scrotal structures was performed. Color and spectral Doppler ultrasound were also utilized to evaluate blood flow to the testicles.  COMPARISON:  None.  FINDINGS: Right testicle  Measurements: 4.3 x 2.0 x 3.4 cm. No mass or microlithiasis visualized.  Left testicle  Measurements: 4.3 x 2.0 x 3.3 cm. No mass or microlithiasis visualized.  Right epididymis:  Normal in size and appearance.  Left epididymis:  Normal in size and appearance.  Hydrocele:  None visualized.  Varicocele:  None visualized.  Pulsed Doppler interrogation of both testes demonstrates normal low resistance arterial and venous waveforms bilaterally.  IMPRESSION: Unremarkable scrotal ultrasound. No evidence for testicular torsion.   Electronically Signed   By: Roanna Raider M.D.   On: 05/26/2014 01:42     EKG Interpretation None      MDM   Final diagnoses:  Chronic back pain  Abrasion of buttock, initial encounter  Noncompliance with medications    Chronic pain with medication noncompliance, using medications appropriately, vs. Narcotic diversion behavior. There is no evidence for rectal bleeding, hemodynamic instability or suspected serious bacterial infection.   Nursing Notes Reviewed/ Care Coordinated Applicable Imaging Reviewed Interpretation of Laboratory Data incorporated into ED treatment  The patient appears reasonably screened and/or stabilized for discharge and I doubt any other medical  condition or other [facility] requiring further screening, evaluation, or treatment in the ED at this time prior to discharge.  Plan: Home Medications- usual; Home Treatments- rest, wound care; return here if the recommended treatment, does not improve the symptoms; Recommended follow up- PCP and pain management asap    Mancel Bale, MD 05/28/14 1341

## 2014-05-27 NOTE — Progress Notes (Signed)
ED CM noted patient to have had 9 ED visits in the past 6 months for similar complaints. Reviewed patient's record, No PCP listed [place] Medicaid is list as  Payor. Met with patient at bedside confirmed information. Patient reports that he is a resident in [facility] part-time and only comes to [place] to visit with his kids. Patient states, he is not intending on making [place] his residence. Discussed the importance and benefits of primary care and having a Medical Home. It was suggested that when he is home he follows up with his PCP so he can have medications refilled before travel, patient verbalizes understanding and was appreciative for the suggestion.  No further ED CM needs identified.

## 2014-05-27 NOTE — ED Notes (Signed)
Pt sts lower back pain with some rectal bleeding this am; pt sts some black and red; pt in nad at present

## 2014-05-27 NOTE — Discharge Instructions (Signed)
For the sore on your back, soak in a tub 2 or 3 times a day to help it heal. Use our resource guide, to find a primary care doctor. Have your doctor refer you to a chronic pain doctor.   Abrasion An abrasion is a cut or scrape of the skin. Abrasions do not extend through all layers of the skin and most heal within 10 days. It is important to care for your abrasion properly to prevent infection. CAUSES  Most abrasions are caused by falling on, or gliding across, the ground or other surface. When your skin rubs on something, the outer and inner layer of skin rubs off, causing an abrasion. DIAGNOSIS  Your caregiver will be able to diagnose an abrasion during a physical exam.  TREATMENT  Your treatment depends on how large and deep the abrasion is. Generally, your abrasion will be cleaned with water and a mild soap to remove any dirt or debris. An antibiotic ointment may be put over the abrasion to prevent an infection. A bandage (dressing) may be wrapped around the abrasion to keep it from getting dirty.  You may need a tetanus shot if:  You cannot remember when you had your last tetanus shot.  You have never had a tetanus shot.  The injury broke your skin. If you get a tetanus shot, your arm may swell, get red, and feel warm to the touch. This is common and not a problem. If you need a tetanus shot and you choose not to have one, there is a rare chance of getting tetanus. Sickness from tetanus can be serious.  HOME CARE INSTRUCTIONS   If a dressing was applied, change it at least once a day or as directed by your caregiver. If the bandage sticks, soak it off with warm water.   Wash the area with water and a mild soap to remove all the ointment 2 times a day. Rinse off the soap and pat the area dry with a clean towel.   Reapply any ointment as directed by your caregiver. This will help prevent infection and keep the bandage from sticking. Use gauze over the wound and under the dressing to  help keep the bandage from sticking.   Change your dressing right away if it becomes wet or dirty.   Only take over-the-counter or prescription medicines for pain, discomfort, or fever as directed by your caregiver.   Follow up with your caregiver within 24-48 hours for a wound check, or as directed. If you were not given a wound-check appointment, look closely at your abrasion for redness, swelling, or pus. These are signs of infection. SEEK IMMEDIATE MEDICAL CARE IF:   You have increasing pain in the wound.   You have redness, swelling, or tenderness around the wound.   You have pus coming from the wound.   You have a fever or persistent symptoms for more than 2-3 days.  You have a fever and your symptoms suddenly get worse.  You have a bad smell coming from the wound or dressing.  MAKE SURE YOU:   Understand these instructions.  Will watch your condition.  Will get help right away if you are not doing well or get worse. Document Released: 12/06/2004 Document Revised: 02/13/2012 Document Reviewed: 01/30/2011 [facility] Patient Information 2015 [place], [place]. This information is not intended to replace advice given to you by your health care provider. Make sure you discuss any questions you have with your health care provider.    Emergency  Department Resource Guide 1) Find a Doctor and Pay Out of Pocket Although you won't have to find out who is covered by your insurance plan, it is a good idea to ask around and get recommendations. You will then need to call the office and see if the doctor you have chosen will accept you as a new patient and what types of options they offer for patients who are self-pay. Some doctors offer discounts or will set up payment plans for their patients who do not have insurance, but you will need to ask so you aren't surprised when you get to your appointment.  2) Contact Your Local Health Department Not all health departments have doctors  that can see patients for sick visits, but many do, so it is worth a call to see if yours does. If you don't know where your local health department is, you can check in your phone book. The CDC also has a tool to help you locate your state's health department, and many state websites also have listings of all of their local health departments.  3) Find a Walk-in Clinic If your illness is not likely to be very severe or complicated, you may want to try a walk in clinic. These are popping up all over the country in pharmacies, drugstores, and shopping centers. They're usually staffed by nurse practitioners or physician assistants that have been trained to treat common illnesses and complaints. They're usually fairly quick and inexpensive. However, if you have serious medical issues or chronic medical problems, these are probably not your best option.  No Primary Care Doctor: - Call Health Connect at  [phone] - they can help you locate a primary care doctor that  accepts your insurance, provides certain services, etc. - Physician Referral Service- [phone]  Chronic Pain Problems: Organization         Address  Phone   Notes  [facility]  [phone] Patients need to be referred by their primary care doctor.   Medication Assistance: Organization         Address  Phone   Notes  [facility] Medication [facility] [address] [place] [place], [place] [address] [phone] --Must be a resident of [facility] -- Must have NO insurance coverage whatsoever (no Medicaid/ Medicare, etc.) -- The pt. MUST have a primary care doctor that directs their care regularly and follows them in the community   [facility]ist  [phone]   [employer]  [phone]    Agencies that provide inexpensive medical care: Organization         Address  Phone   Notes  [facility]  [phone]   [facility]    [phone]   [facility] [address] [place], [place] [address] [phone]   [facility] [address] [place] [place] [phone]   Planned Parenthood    [phone]   [facility]    [phone]   [facility]  [address], [place] Phone:  [phone], Fax:  [phone] Hours of Operation:  9 am - 6 pm, M-F.  Also accepts Medicaid/Medicare and self-pay.  [facility]  [address], [place] Phone: [phone], Fax: [phone]. Hours of Operation:  8:30 am - 5:30 pm, M-F.  Also accepts Medicaid and self-pay.  [facility] [place] [address], [place] Phone: [phone]   [facility] [address] Natasha Bence [place], [place] [phone], Ext. [phone] Mondays & Thursdays: 7-9 AM.  First 15 patients are seen on a first come, first serve basis.    Medicaid-accepting [employer] Providers:  Organization         Address  Phone   Notes  [facility] [address], [place] [phone] Also accepts self-pay patients.  [facility] [address] Laurell Josephs [place], [place]  [phone]   [facility] [address], [place] [phone]   [facility] Family Medicine [address] [place] [phone]   Renaye Rakers [address], [place]   [phone] Only accepts [place] patients after they have their name applied to their card.   Self-Pay (no insurance) in [facility]:  Organization         Address  Phone   Notes  Sickle Cell Patients, [facility] Internal Medicine [address] [place], [place] [phone]   [facility] [address] [place], [place] [phone]   [facility]  [address], [place] [phone]   [facility]/Dr.  Osei-Bonsu  [address], [place] or [address], [place] [phone] Phone number for both [place] and [place] locations is the same.  [facility] [address], [place] [phone]   [facility] [address] [place] or [address] [phone]   [facility] [address] [place] [phone], phone; [phone], fax Sees patients 1st and 3rd Saturday of every month.  Must not qualify for public or private insurance (i.e. Medicaid, Medicare, [place] Health Choice, Veterans' Benefits)  Household income should be no more than 200% of the poverty level The clinic cannot treat you if you are pregnant or think you are pregnant  Sexually transmitted diseases are not treated at the clinic.    Dental Care: Organization         Address  Phone  Notes  [facility] Department of [facility] [address] [place], [place] [phone] Accepts children up to age 5 who are enrolled in [place] or [place] Health Choice; pregnant women with a Medicaid card; and children who have applied for Medicaid or [place] Health Choice, but were declined, whose parents can pay a reduced fee at time of service.  [facility] of [facility]  [address], [place] [phone] Accepts children up to age 2 who are enrolled in [place] or [place] Health Choice; pregnant women with a Medicaid card; and children who have applied for Medicaid or [place] Health Choice, but were declined, whose parents can pay a reduced fee at time of service.  [facility]  [address] [place], [place] [phone] Patients are seen by appointment only. Walk-ins are not accepted. [facility] will see patients 26 years of age and older. Monday - Tuesday (8am-5pm) Most Wednesdays (8:30-5pm) $30 per visit, cash only  [facility] Adult  Dental Access PROGRAM  [address], [facility] [phone] Patients are seen by appointment only. Walk-ins are not accepted. [facility] will see patients 25 years of age and older. One  Wednesday Evening (Monthly: Volunteer Based).  $30 per visit, cash only  [employer] of Dentistry Clinics  [phone] for adults; Children under age 27, call Graduate Pediatric Dentistry at [phone]. Children aged 66-14, please call [phone] to request a pediatric application.  Dental services are provided in all areas of dental care including fillings, crowns and bridges, complete and partial dentures, implants, gum treatment, root canals, and extractions. Preventive care is also provided. Treatment is provided to both adults and children. Patients are selected via a lottery and there is often a waiting list.   [facility] [address] [place]  [phone] [email or web address]   [facility] [address] [place], [place] [phone], Ext. [phone] Second and Fourth Thursday of each month, opens at 6:30 AM; Clinic ends at 9 AM.  Patients are seen on a first-come first-served basis, and a limited number are seen during each clinic.   [facility]  [address] Ether Griffins [place], [place] [phone]   Eligibility Requirements You must have lived in [place], [place], or [place] counties for at least the last three months.   You cannot be eligible for state or federal sponsored [place], including [employer], [place], or [employer].   You generally cannot be eligible for healthcare insurance through your employer.    How to apply: Eligibility screenings are held every Tuesday and Wednesday afternoon from 1:00 pm until 4:00 pm. You do not need an appointment for the interview!  [facility] [address], [place], [place] [phone]   [facility] Health Department  [phone]   [facility]  Health Department  [phone]   [facility] Health Department  [phone]    Behavioral Health Resources in the Community: Intensive Outpatient Programs Organization         Address  Phone  Notes  [facility] [address] [place], [place] [phone]   [facility] Outpatient [address] [place], [place] [phone]   [facility] [address] [place], [place]  [phone]   [facility] Mental Health [address],  [place], [place] [phone] or [phone]   Substance Abuse Resources Organization         Address  Phone  Notes  [facility] Drug [facility]  [phone]   [facility]  [phone]   [facility] [place]  [phone]   Floydene Flock  [phone]   Residential & Outpatient Substance Abuse Program  [phone]   Psychological Services Organization         Address  Phone  Notes  [facility] Behavioral Health  [phone]   [facility] Services  [phone]   [facility] Mental Health [address], [place] [phone] or [phone]    Mobile Crisis Teams Organization         Address  Phone  Notes  [facility], Mobile Crisis Care Unit  [phone]   [facility]  [address] [place], [place] [phone]   Doristine Locks [address] [place] [phone]    Self-Help/Support Groups Organization         Address  Phone             Notes  [facility] [place] - variety of support groups  336- I7437963 Call for more information  Narcotics Anonymous (NA), [facility] [address], [employer] [place]  2 meetings at this location  Residential Treatment Programs Organization         Address  Phone  Notes  [facility] [address],    [place]  [phone]   [facility]  [address], [place] [phone], [place], [place] [phone]   [facility]  Facility [address], [place] [phone] Admissions: 8am-3pm M-F  [facility] [address]    [place], [place] [phone]   [facility] [address], [place], [place] [phone]   [facility] [address]  [place], [place] [phone]   [facility] - Intensive Outpatient [address], [place], [place] [phone]   [facility] [address]  [place], [place] [phone] or [phone]   [facility] ([facility]) [address] [place], [place] [phone] Accepts Medicaid  [facility] [address]  [place] [phone] Substance Abuse/Addiction Treatment   [facility] Behavioral Health Resources Organization         Address  Phone  Notes  [facility]  [phone]) [phone]   Angie FavaJulie Brannon, PhD [address] [place], [place]   [phone]) [phone] or [phone]   [facility]   [address] [place], [place] [phone](336) [phone]   [facility] [address], [place], [place] [phone] Insurance/Medicaid/sponsorship through [facility] [address]                                    [place], [place] [phone] Therapy/tele-psych/case  [facility] [address]   [place], [place] [phone]    Dr. Lolly MustacheArfeen  [phone]   [facility]  [employer] [facility] 1) [address], [facility] 2) [address], [place] 3)  [address], [place] [phone]) [phone]  [phone](336) [phone]   [facility] Child Abuse Hotline [phone](336) [phone] or [phone] (After Hours)

## 2014-06-01 ENCOUNTER — Encounter (HOSPITAL_COMMUNITY): Payer: Self-pay

## 2014-06-01 ENCOUNTER — Emergency Department (HOSPITAL_COMMUNITY)
Admission: EM | Admit: 2014-06-01 | Discharge: 2014-06-03 | Disposition: A | Discharge status: Home / Self Care | Payer: No Typology Code available for payment source

## 2014-06-01 DIAGNOSIS — G8929 Other chronic pain: Secondary | ICD-10-CM | POA: Insufficient documentation

## 2014-06-01 DIAGNOSIS — Z8669 Personal history of other diseases of the nervous system and sense organs: Secondary | ICD-10-CM | POA: Insufficient documentation

## 2014-06-01 DIAGNOSIS — R45851 Suicidal ideations: Secondary | ICD-10-CM | POA: Insufficient documentation

## 2014-06-01 DIAGNOSIS — M545 Low back pain: Secondary | ICD-10-CM | POA: Insufficient documentation

## 2014-06-01 DIAGNOSIS — F332 Major depressive disorder, recurrent severe without psychotic features: Secondary | ICD-10-CM | POA: Diagnosis present

## 2014-06-01 DIAGNOSIS — M549 Dorsalgia, unspecified: Secondary | ICD-10-CM

## 2014-06-01 DIAGNOSIS — Z79899 Other long term (current) drug therapy: Secondary | ICD-10-CM | POA: Insufficient documentation

## 2014-06-01 DIAGNOSIS — I10 Essential (primary) hypertension: Secondary | ICD-10-CM | POA: Insufficient documentation

## 2014-06-01 LAB — CBC
HCT: 38.1 % — ABNORMAL LOW (ref 39.0–52.0)
Hemoglobin: 12.2 g/dL — ABNORMAL LOW (ref 13.0–17.0)
MCH: 28.6 pg (ref 26.0–34.0)
MCHC: 32 g/dL (ref 30.0–36.0)
MCV: 89.2 fL (ref 78.0–100.0)
PLATELETS: 343 10*3/uL (ref 150–400)
RBC: 4.27 MIL/uL (ref 4.22–5.81)
RDW: 14.6 % (ref 11.5–15.5)
WBC: 9.3 10*3/uL (ref 4.0–10.5)

## 2014-06-01 LAB — COMPREHENSIVE METABOLIC PANEL
ALT: 11 U/L (ref 0–53)
AST: 21 U/L (ref 0–37)
Albumin: 4 g/dL (ref 3.5–5.2)
Alkaline Phosphatase: 81 U/L (ref 39–117)
Anion gap: 9 (ref 5–15)
BUN: 12 mg/dL (ref 6–23)
CO2: 23 mmol/L (ref 19–32)
CREATININE: 1.35 mg/dL (ref 0.50–1.35)
Calcium: 9.1 mg/dL (ref 8.4–10.5)
Chloride: 108 mmol/L (ref 96–112)
GFR, EST AFRICAN AMERICAN: 72 mL/min — AB (ref >90)
GFR, EST NON AFRICAN AMERICAN: 62 mL/min — AB (ref >90)
Glucose, Bld: 112 mg/dL — ABNORMAL HIGH (ref 70–99)
Potassium: 3.5 mmol/L (ref 3.5–5.1)
Sodium: 140 mmol/L (ref 135–145)
Total Bilirubin: 0.6 mg/dL (ref 0.3–1.2)
Total Protein: 6.7 g/dL (ref 6.0–8.3)

## 2014-06-01 LAB — RAPID URINE DRUG SCREEN, HOSP PERFORMED
Amphetamines: NOT DETECTED
Barbiturates: NOT DETECTED
Benzodiazepines: NOT DETECTED
COCAINE: NOT DETECTED
Opiates: NOT DETECTED
TETRAHYDROCANNABINOL: NOT DETECTED

## 2014-06-01 LAB — ETHANOL: Alcohol, Ethyl (B): <5 mg/dL (ref 0–9)

## 2014-06-01 MED ORDER — LORAZEPAM 1 MG PO TABS
1.0000 mg | ORAL_TABLET | Freq: Three times a day (TID) | ORAL | Status: DC | PRN
  Administered 2014-06-02 – 2014-06-03 (×2): 1 mg via ORAL
  Filled 2014-06-01: qty 1

## 2014-06-01 MED ORDER — ACETAMINOPHEN 325 MG PO TABS
650.0000 mg | ORAL_TABLET | ORAL | Status: DC | PRN
  Administered 2014-06-01 – 2014-06-03 (×2): 650 mg via ORAL
  Filled 2014-06-01 (×2): qty 2

## 2014-06-01 MED ORDER — OXYCODONE HCL 5 MG PO TABS
5.0000 mg | ORAL_TABLET | Freq: Three times a day (TID) | ORAL | Status: DC | PRN
  Administered 2014-06-01 – 2014-06-03 (×4): 5 mg via ORAL
  Filled 2014-06-01 (×4): qty 1

## 2014-06-01 MED ORDER — ONDANSETRON HCL 4 MG PO TABS: 4.0000 mg | ORAL_TABLET | Freq: Three times a day (TID) | ORAL | Status: DC | PRN

## 2014-06-01 NOTE — BH Assessment (Addendum)
Tele Assessment Note   Garrett Cole is an 44 y.o. male who came into the [facility] tonight due to increasing depression and hopelessness related to his chronic pain.  Pt reported that over the last few days he has become increasingly suicidal and homicidal due to despair over his chronic pain issues.   Pt stated that he is "tired of being in pain" and "doesn't want to live like this."  Pt also stated earlier that he was feeling anger toward his doctor and having thoughts of hurting him but, would never go through with it. Pt reported now that he is having general thoughts of hurting other people with no one in partiicular in mind.  Pt stated that although he is continually thinking of killing himself he is also continually thinking of ways to kill himself without a specific plan identified. Pt states he sleeps about 7 hours a night and has neither gained or lost weight recently.  Pt has many symptoms of depression including deep sadness, fatigue, guilt, low self-esteem, tearfulness, isolating himself, loss of interest in activities even pleasurable ones, feelings helpless and hopeless and anger/irritability.  Pt also has many symptoms of anxiety including excessive worry, hx of panic attacks, restlessness and nightmares.  Pt has been previously diagnosed with PTSD. Pt denies any AVH or SH urges other than SI.   Pt states that he is seeing Dr. Orlinda Blalock at the [facility] for medication management and has no therapist.  Pt stated that he has been IP on 2 occasions (once at [facility] about 1 year ago and another time at a [place] hospital) but has not received OP treatment.  Pt stated that he has never actually attempted to kill himself or anyone else.  Pt stated he has no hx of violence or aggression although, pt stated he does get angry often.  Pt states he uses no substances other than medications as they are prescribed for him. Pt denies physical, emotional or sexual abuse at any time.  Pt states he has support from his mother  and his MGM.  Pt was dressed in scrubs and lying in bed during the assessment. Pt was alert, soft-spoken and cooperative.  Pt's speech was soft, slow and coherent. Pt moved very little during the assessment.  Pt's mood was depressed and possible anxious and his flat affect was congruent.  Pt was oriented x 4.   Axis I: 311 Unspecified Depressive Disorder; PTSD by hx Axis II: Deferred Axis III:  Past Medical History  Diagnosis Date  . 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 Hypertension   . Chronic back pain   . Lumbar radiculopathy    Axis IV: other psychosocial or environmental problems, problems related to social environment, problems with access to health care services and problems with primary support group Axis V: 11-20 some danger of hurting self or others possible OR occasionally fails to maintain minimal personal hygiene OR gross impairment in communication  Past Medical History:  Past Medical History  Diagnosis Date  . 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 Hypertension   . Chronic back pain   . Lumbar radiculopathy     Past Surgical History  Procedure Laterality Date  . Leg surgery      Family History: History reviewed. No pertinent family history.  Social History:  reports that he has never smoked. He does not have any smokeless tobacco history on file. He reports that  he does not drink alcohol or use illicit drugs.  Additional Social History:  Alcohol / Drug Use Prescriptions: See PTA list History of alcohol / drug use?: No history of alcohol / drug abuse (pt denies)  CIWA: CIWA-Ar BP: 125/66 mmHg Pulse Rate: 83 COWS:    PATIENT STRENGTHS: (choose at least two) Average or above average intelligence Capable of independent living Communication skills Supportive family/friends  Allergies:  Allergies  Allergen Reactions  . Hydrocodone Nausea And Vomiting  . Ibuprofen Nausea And Vomiting  . Iodine Itching  and Rash    Home Medications:  (Not in a hospital admission)  OB/GYN Status:  No LMP for male patient.  General Assessment Data [facility] Is this a Tele or Face-to-Face Assessment?: Tele Assessment Is this an Initial Assessment or a Re-assessment for this encounter?: Initial Assessment Living Arrangements: Other (Comment) (Friend) Can pt return to current living arrangement?: Yes Admission Status: Voluntary Is patient capable of signing voluntary admission?: Yes Transfer from: Unknown Referral Source: Self/Family/Friend  Medical Screening Exam [facility] Walk-in ONLY) Medical Exam completed: No Reason for MSE not completed: Other: (labs still out)  [facility] Living Arrangements: Other (Comment) (Friend) [facility]  Education Status Is patient currently in school?: No Current Grade: na Highest grade of school patient has completed: na Name of school: na Contact person: na  Risk to self with the past 6 months Suicidal Ideation: Yes-Currently Present Suicidal Intent: Yes-Currently Present Is patient at risk for suicide?: Yes Suicidal Plan?: Yes-Currently Present Specify Current Suicidal Plan: no specific plan but many in his mind he says Access to Means: No What has been your use of drugs/alcohol within the last 12 months?: none Previous Attempts/Gestures: No (denies) How many times?: 0 Other Self Harm Risks: none per pt Triggers for Past Attempts: Unpredictable Intentional Self Injurious Behavior: None Family Suicide History: Unknown Recent stressful life event(s):  ("nothing in particular" "I'm just depressed") Persecutory voices/beliefs?: No Depression: Yes Depression Symptoms: Tearfulness, Isolating, Fatigue, Guilt, Loss of interest in usual pleasures, Feeling worthless/self pity, Feeling angry/irritable Substance abuse history and/or treatment for substance abuse?: No Suicide prevention  information given to non-admitted patients: Not applicable  Risk to Others within the past 6 months Homicidal Ideation: No Thoughts of Harm to Others: Yes-Currently Present Comment - Thoughts of Harm to Others: Pt said he he has lots of thoughts of haming others (no plans and no one in particular per pt) Current Homicidal Intent: No (denies) Current Homicidal Plan: No Access to Homicidal Means: No (denies) Identified Victim: no History of harm to others?: No (denies) Assessment of Violence: None Noted (denies) Violent Behavior Description: na Does patient have access to weapons?: No (denies) Criminal Charges Pending?: No Does patient have a court date: No  Psychosis Hallucinations: None noted (denies) Delusions: None noted  Mental Status Report Appearance/Hygiene: In scrubs, Unremarkable Eye Contact: Fair Motor Activity: Unremarkable (little to no movement) Speech: Soft, Slow, Logical/coherent, Unremarkable Level of Consciousness: Quiet/awake Mood: Depressed Affect: Flat Anxiety Level: Minimal Thought Processes: Coherent, Relevant Judgement: Partial Orientation: Person, Place, Time, Situation Obsessive Compulsive Thoughts/Behaviors: Unable to Assess  Cognitive Functioning Concentration: Fair Memory: Recent Intact, Remote Intact IQ: Average Insight: Fair Impulse Control: Fair Appetite: Good Weight Loss: 0 Weight Gain: 0 Sleep: No Change Total Hours of Sleep: 7 Vegetative Symptoms: Unable to Assess  ADLScreening [facility] Assessment Services) Patient's cognitive ability adequate to safely complete daily activities?: Yes Patient able to  express need for assistance with ADLs?: Yes Independently performs ADLs?: Yes (appropriate for developmental age)  Prior Inpatient Therapy Prior Inpatient Therapy: Yes Prior Therapy Dates: various [facility] [place] Reason for Treatment: Depression, PTSD, SI  Prior Outpatient Therapy Prior Outpatient  Therapy: No Prior Therapy Dates: na Prior Therapy Facilty/Provider(s): na Reason for Treatment: na  ADL Screening (condition at time of admission) Patient's cognitive ability adequate to safely complete daily activities?: Yes Patient able to express need for assistance with ADLs?: Yes Independently performs ADLs?: Yes (appropriate for developmental age)       Abuse/Neglect Assessment (Assessment to be complete while patient is alone) Physical Abuse: Denies Verbal Abuse: Denies Sexual Abuse: Denies Exploitation of patient/patient's resources: Denies     Merchant navy officer (For Healthcare) Does patient have an advance directive?: No Would patient like information on creating an advanced directive?: No - patient declined information    Additional Information 1:1 In Past 12 Months?: No CIRT Risk: No Elopement Risk: No Does patient have medical clearance?: No     Disposition:  Disposition Initial Assessment Completed for this Encounter: Yes Disposition of Patient: Other dispositions (Pending review w [facility] Extender) Other disposition(s): Other (Comment)  Per Donell Sievert, PA: Meets criteria for IP w SI, HI and will not contract for safety.  Concerns/questions regarding pain management issues and link to mental health issues. Per [employer], [facility]: After discussing further w/ Extender, Donell Sievert, pt needs to be re-evaluated by psychiatry in the morning to determine appropriate disposition.  Spoke to Dr. Rubin Payor at [facility]: Advised of recommendation. He agreed. Spoke to [employer], Charity fundraiser at [employer]: Advised of plan.  She advised that pt had recently been moved to [facility] and that she would report plan to RN there.  Beryle Flock, MS, [facility], [facility] Triage Specialist [facility] T 06/01/2014 9:38 PM

## 2014-06-01 NOTE — ED Notes (Signed)
MD at bedside. 

## 2014-06-01 NOTE — ED Notes (Signed)
Pt and belongings wanded.  [facility]

## 2014-06-01 NOTE — ED Provider Notes (Signed)
CSN: [id number]     Arrival date & time 06/01/14  1812 History   First MD Initiated Contact with Patient 06/01/14 1822     Chief Complaint  Patient presents with  . Suicidal  . Homicidal     (Consider location/radiation/quality/duration/timing/severity/associated sxs/prior Treatment) The history is provided by the patient.   patient presents with depression and some suicidal thoughts. Has been dealing with chronic back pain for years and the pain is constant and somewhat worse. There is no end of the pain at site. He is not a surgical candidate. States it is been his depression worsens. He's had some mild depression with thoughts and some possible homicidal thoughts. States he would be homicidal towards his doctor but states he would not do anything. He has some chronic weakness in the left leg. He does see pain management for her. No fevers. He states it is just gotten worse and is having trouble dealing with. He also has a history of depression at times without his back pain.  Past Medical History  Diagnosis Date  . 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 Hypertension   . Chronic back pain   . Lumbar radiculopathy    Past Surgical History  Procedure Laterality Date  . Leg surgery     History reviewed. No pertinent family history. History  Substance Use Topics  . Smoking status: Never Smoker   . Smokeless tobacco: Not on file  . Alcohol Use: No    Review of Systems  Constitutional: Negative for activity change and appetite change.  Eyes: Negative for pain.  Respiratory: Negative for chest tightness and shortness of breath.   Cardiovascular: Negative for chest pain and leg swelling.  Gastrointestinal: Negative for nausea, vomiting, abdominal pain and diarrhea.  Genitourinary: Negative for flank pain.  Musculoskeletal: Positive for back pain. Negative for neck stiffness.  Skin: Negative for rash.  Neurological: Positive for weakness and numbness.  Negative for headaches.  Psychiatric/Behavioral: Positive for suicidal ideas and dysphoric mood. Negative for behavioral problems.      Allergies  Hydrocodone; Ibuprofen; and Iodine  Home Medications   Prior to Admission medications   Medication Sig Start Date End Date Taking? Authorizing Provider  amLODipine (NORVASC) 10 MG tablet Take 10 mg by mouth daily.   Yes Historical Provider, MD  oxyCODONE-acetaminophen (PERCOCET/ROXICET) 5-325 MG per tablet Take 1 tablet by mouth every 6 (six) hours as needed for moderate pain or severe pain. Patient not taking: Reported on 05/25/2014 05/08/14   Trixie DredgeEmily West, PA-C  predniSONE (DELTASONE) 10 MG tablet 6,5,4,3,2,1 taper Patient not taking: Reported on 03/05/2014 02/26/14   Elson AreasLeslie K Sofia, PA-C   BP 125/66 mmHg  Pulse 83  Temp(Src) 98.3 F (36.8 C) (Oral)  Resp 16  SpO2 98% Physical Exam  Constitutional: He appears well-developed and well-nourished.  HENT:  Head: Normocephalic.  Cardiovascular: Normal rate and regular rhythm.   Pulmonary/Chest: Effort normal.  Abdominal: Soft.  Musculoskeletal: He exhibits tenderness.  Some lumbar tenderness.  Neurological: He is alert.  Skin: Skin is warm.    ED Course  Procedures (including critical care time) Labs Review Labs Reviewed  CBC - Abnormal; Notable for the following:    Hemoglobin 12.2 (*)    HCT 38.1 (*)    All other components within normal limits  COMPREHENSIVE METABOLIC PANEL - Abnormal; Notable for the following:    Glucose, Bld 112 (*)    GFR calc non Af Amer 62 (*)  GFR calc Af Amer 72 (*)    All other components within normal limits  ETHANOL  URINE RAPID DRUG SCREEN (HOSP PERFORMED)    Imaging Review No results found.   EKG Interpretation None      MDM   Final diagnoses:  Suicidal ideation  Chronic back pain    Patient with chronic back pain. Now is depressed with suicidal and some homicidal thoughts. Has been seen by TTS. The extender acetaminophen  thinks that he needs to be seen by psychiatrist the morning. They state he needs inpatient placement want to further evaluate his chronic pain. Will await placement    Benjiman Core, MD 06/01/14 2227

## 2014-06-01 NOTE — ED Notes (Signed)
Pt. To [facility] from ED ambulatory without difficulty, to room  . [facility] Pt. Is alert and oriented, warm and dry in no distress. Pt. Denies AVH but endorses SI/HI with no plan and towards no one in particular. Pt. Calm and cooperative. Pt. Made aware of security cameras and Q15 minute rounds. Pt. Encouraged to let Nursing staff know of any concerns or needs.

## 2014-06-01 NOTE — ED Notes (Signed)
Per pt, called mobile crisis today.  Pt has been si/hi since yesterday.  No specific plan.  HI towards just anyone.  No specific person.  [facility] Pt has chronic back pain.  "Tired of being in pain". Denies need for addiction assistance.

## 2014-06-02 DIAGNOSIS — F332 Major depressive disorder, recurrent severe without psychotic features: Secondary | ICD-10-CM | POA: Diagnosis present

## 2014-06-02 DIAGNOSIS — R45851 Suicidal ideations: Secondary | ICD-10-CM

## 2014-06-02 MED ORDER — MIRTAZAPINE 30 MG PO TABS
15.0000 mg | ORAL_TABLET | Freq: Every day | ORAL | Status: DC
  Administered 2014-06-02: 15 mg via ORAL
  Filled 2014-06-02: qty 1

## 2014-06-02 MED ORDER — DULOXETINE HCL 30 MG PO CPEP
30.0000 mg | ORAL_CAPSULE | Freq: Every day | ORAL | Status: DC
  Administered 2014-06-02 – 2014-06-03 (×2): 30 mg via ORAL
  Filled 2014-06-02 (×3): qty 1

## 2014-06-02 MED ORDER — PANTOPRAZOLE SODIUM 40 MG PO TBEC
40.0000 mg | DELAYED_RELEASE_TABLET | Freq: Every day | ORAL | Status: DC
  Administered 2014-06-02 – 2014-06-03 (×2): 40 mg via ORAL
  Filled 2014-06-02 (×2): qty 1

## 2014-06-02 MED ORDER — AMLODIPINE BESYLATE 10 MG PO TABS
10.0000 mg | ORAL_TABLET | Freq: Every day | ORAL | Status: DC
  Administered 2014-06-02 – 2014-06-03 (×2): 10 mg via ORAL
  Filled 2014-06-02 (×2): qty 1

## 2014-06-02 NOTE — ED Notes (Signed)
Patient presents calm and cooperative lying in bed watching TV; denies any current SI/HI/AVH. NAD. Ongoing monitoring remains in place.

## 2014-06-02 NOTE — Progress Notes (Signed)
Pt states his pcp is [facility]  Pt mentioned to Cm while cm was assessing him that he has not had his BP medication, GERD med not xanax CM spoke with Dr Fayrene FearingJames EDP about pt voiced concern.  Orders written Cm discussed this with [facility] RN, Patsy LagerYolanda

## 2014-06-02 NOTE — ED Notes (Signed)
Patient up and interacting with staff.  Requested pain medication.  He reports SI has decreased; still has some passive thoughts.  He is pleasant, calm and cooperative.

## 2014-06-02 NOTE — ED Notes (Signed)
Acuity note:  Patient calm and cooperative.  Pleasant with staff.  Low acuity.

## 2014-06-02 NOTE — BH Assessment (Signed)
[facility] Assessment Progress Note  The following facilities have been contacted to seek placement for this pt, with results as noted:  Beds available, information faxed, decision pending:  [facility]  [facility]  C[place]ter [place], [place] Triage Specialist 06/02/2014 @ 16:02

## 2014-06-02 NOTE — ED Notes (Signed)
Pt resting in bed, eyes closed, breathing even and unlabored. Q15 min safety checks maintained. Will continue to monitor.  

## 2014-06-02 NOTE — Consult Note (Signed)
[place] Psychiatry Consult   Reason for Consult: RECURRENT MAJOR DEPRESSION,  Referring Physician:  EDP Patient Identification: Garrett Cole MRN:  [id number] Principal Diagnosis: Major depressive disorder, recurrent severe without psychotic features Diagnosis:   Patient Active Problem List   Diagnosis Date Noted  . Major depressive disorder, recurrent severe without psychotic features [F33.2] 06/02/2014    Priority: High    Total Time spent with patient: 1 hour  Subjective:   Garrett Cole is a 44 y.o. male patient admitted with Major depression, Chronic back pain.  HPI: AA male, 44 years old was evaluated for increased depression secondary to severe chronic pain.  Patient is a veteran who sees a Teacher, music at the [place] at [facility].  Patient reported that his depression has increased due to pain to his back and that his PMD at the [facility] clinic has stopped seeing him.  Patient stated that he is in such a severe pain that he feels homicidal towards his [place] doctor.  Patient denies SI/AVH but stated that he occasionally want to hurt his [place] doctor for refusing to see him and prescribe his pain medications.  He denies previous suicidal or homicidal attempts.   He reports feeling hopeless and helpless. Patient also stated that he does not want to hurt his provider but want his pain taken care of.  He reports previous hospitalization at [facility].  Patient stated that he is tired of being in pain.   He has been accepted for admission and will be transferred to any unit with available inpatient Psychiatric bed.  We have resumed his home medications.  HPI Elements:   Location:  Recurrent major depression, Chronic PTSD by hx, Homicidal ideation. Quality:  severe, chronic back pain treatment. Severity:  severe. Timing:  Acute. Duration:  Chronic mental illness. Context:  Seeking treatment for depression and chronic pain..  Past Medical History:  Past Medical History  Diagnosis  Date  . 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 Hypertension   . Chronic back pain   . Lumbar radiculopathy     Past Surgical History  Procedure Laterality Date  . Leg surgery     Family History: History reviewed. No pertinent family history. Social History:  History  Alcohol Use No     History  Drug Use No    History   Social History  . Marital Status: Single    Spouse Name: N/A  . Number of Children: N/A  . Years of Education: N/A   Social History Main Topics  . Smoking status: Never Smoker   . Smokeless tobacco: Not on file  . Alcohol Use: No  . Drug Use: No  . Sexual Activity: Not on file   Other Topics Concern  . None   Social History Narrative   Additional Social History:    Prescriptions: See PTA list History of alcohol / drug use?: No history of alcohol / drug abuse (pt denies)     Allergies:   Allergies  Allergen Reactions  . Hydrocodone Nausea And Vomiting  . Ibuprofen Nausea And Vomiting  . Iodine Itching and Rash    Labs:  Results for orders placed or performed during the hospital encounter of 06/01/14 (from the past 48 hour(s))  Urine rapid drug screen (hosp performed)     Status: None   Collection Time: 06/01/14  6:12 PM  Result Value Ref Range   Opiates NONE DETECTED NONE DETECTED   Cocaine NONE DETECTED NONE  DETECTED   Benzodiazepines NONE DETECTED NONE DETECTED   Amphetamines NONE DETECTED NONE DETECTED   Tetrahydrocannabinol NONE DETECTED NONE DETECTED   Barbiturates NONE DETECTED NONE DETECTED    Comment:        DRUG SCREEN FOR MEDICAL PURPOSES ONLY.  IF CONFIRMATION IS NEEDED FOR ANY PURPOSE, NOTIFY LAB WITHIN 5 DAYS.        LOWEST DETECTABLE LIMITS FOR URINE DRUG SCREEN Drug Class       Cutoff (ng/mL) Amphetamine      1000 Barbiturate      200 Benzodiazepine   712 Tricyclics       458 Opiates          300 Cocaine          300 THC              50   Ethanol (ETOH)     Status: None   Collection  Time: 06/01/14  6:38 PM  Result Value Ref Range   Alcohol, Ethyl (B) <5 0 - 9 mg/dL    Comment:        LOWEST DETECTABLE LIMIT FOR SERUM ALCOHOL IS 11 mg/dL FOR MEDICAL PURPOSES ONLY   CBC     Status: Abnormal   Collection Time: 06/01/14  6:40 PM  Result Value Ref Range   WBC 9.3 4.0 - 10.5 K/uL   RBC 4.27 4.22 - 5.81 MIL/uL   Hemoglobin 12.2 (L) 13.0 - 17.0 g/dL   HCT 38.1 (L) 39.0 - 52.0 %   MCV 89.2 78.0 - 100.0 fL   MCH 28.6 26.0 - 34.0 pg   MCHC 32.0 30.0 - 36.0 g/dL   RDW 14.6 11.5 - 15.5 %   Platelets 343 150 - 400 K/uL  Comprehensive metabolic panel     Status: Abnormal   Collection Time: 06/01/14  6:40 PM  Result Value Ref Range   Sodium 140 135 - 145 mmol/L   Potassium 3.5 3.5 - 5.1 mmol/L   Chloride 108 96 - 112 mmol/L   CO2 23 19 - 32 mmol/L   Glucose, Bld 112 (H) 70 - 99 mg/dL   BUN 12 6 - 23 mg/dL   Creatinine, Ser 1.35 0.50 - 1.35 mg/dL   Calcium 9.1 8.4 - 10.5 mg/dL   Total Protein 6.7 6.0 - 8.3 g/dL   Albumin 4.0 3.5 - 5.2 g/dL   AST 21 0 - 37 U/L   ALT 11 0 - 53 U/L   Alkaline Phosphatase 81 39 - 117 U/L   Total Bilirubin 0.6 0.3 - 1.2 mg/dL   GFR calc non Af Amer 62 (L) >90 mL/min   GFR calc Af Amer 72 (L) >90 mL/min    Comment: (NOTE) The eGFR has been calculated using the CKD EPI equation. This calculation has not been validated in all clinical situations. eGFR's persistently <90 mL/min signify possible Chronic Kidney Disease.    Anion gap 9 5 - 15    Vitals: Blood pressure 132/93, pulse 84, temperature 98 F (36.7 C), temperature source Oral, resp. rate 16, SpO2 99 %.  Risk to Self: Suicidal Ideation: Yes-Currently Present Suicidal Intent: Yes-Currently Present Is patient at risk for suicide?: Yes Suicidal Plan?: Yes-Currently Present Specify Current Suicidal Plan: no specific plan but many in his mind he says Access to Means: No What has been your use of drugs/alcohol within the last 12 months?: none How many times?: 0 Other Self Harm  Risks: none per pt Triggers for Past Attempts: Unpredictable Intentional  Self Injurious Behavior: None Risk to Others: Homicidal Ideation: No Thoughts of Harm to Others: Yes-Currently Present Comment - Thoughts of Harm to Others: Pt said he he has lots of thoughts of haming others (no plans and no one in particular per pt) Current Homicidal Intent: No (denies) Current Homicidal Plan: No Access to Homicidal Means: No (denies) Identified Victim: no History of harm to others?: No (denies) Assessment of Violence: None Noted (denies) Violent Behavior Description: na Does patient have access to weapons?: No (denies) Criminal Charges Pending?: No Does patient have a court date: No Prior Inpatient Therapy: Prior Inpatient Therapy: Yes Prior Therapy Dates: various [facility] Reason for Treatment: Depression, PTSD, SI Prior Outpatient Therapy: Prior Outpatient Therapy: No Prior Therapy Dates: na Prior Therapy Facilty/Provider(s): na Reason for Treatment: na  Current Facility-Administered Medications  Medication Dose Route Frequency Provider Last Rate Last Dose  . acetaminophen (TYLENOL) tablet 650 mg  650 mg Oral Q4H PRN Davonna Belling, MD   650 mg at 06/01/14 2221  . DULoxetine (CYMBALTA) DR capsule 30 mg  30 mg Oral Daily Jaterrius Ricketson   30 mg at 06/02/14 1410  . LORazepam (ATIVAN) tablet 1 mg  1 mg Oral Q8H PRN Davonna Belling, MD      . mirtazapine (REMERON) tablet 15 mg  15 mg Oral QHS Edman Lipsey      . ondansetron (ZOFRAN) tablet 4 mg  4 mg Oral Q8H PRN Davonna Belling, MD      . oxyCODONE (Oxy IR/ROXICODONE) immediate release tablet 5 mg  5 mg Oral Q8H PRN Davonna Belling, MD   5 mg at 06/02/14 0803   Current Outpatient Prescriptions  Medication Sig Dispense Refill  . ALPRAZolam (XANAX) 1 MG tablet Take 1 mg by mouth 3 (three) times daily as needed for anxiety.    Marland Kitchen amLODipine (NORVASC) 10 MG tablet Take 10 mg by mouth daily.    Marland Kitchen  buPROPion (WELLBUTRIN XL) 150 MG 24 hr tablet Take 150 mg by mouth daily.    . cyclobenzaprine (FLEXERIL) 10 MG tablet Take 10 mg by mouth 2 (two) times daily as needed for muscle spasms.    . DULoxetine (CYMBALTA) 60 MG capsule Take 60 mg by mouth 2 (two) times daily.    Marland Kitchen lidocaine (LIDODERM) 5 % Place 1 patch onto the skin daily as needed (for pain). Remove & Discard patch within 12 hours or as directed by MD    . mirtazapine (REMERON) 30 MG tablet Take 30 mg by mouth at bedtime.    Marland Kitchen omeprazole (PRILOSEC) 20 MG capsule Take 20 mg by mouth daily.    Marland Kitchen oxyCODONE (OXY IR/ROXICODONE) 5 MG immediate release tablet Take 5 mg by mouth 3 (three) times daily.    . polyethylene glycol (MIRALAX / GLYCOLAX) packet Take 17 g by mouth daily as needed for mild constipation.    . sildenafil (VIAGRA) 100 MG tablet Take 100 mg by mouth daily as needed for erectile dysfunction.    Marland Kitchen oxyCODONE-acetaminophen (PERCOCET/ROXICET) 5-325 MG per tablet Take 1 tablet by mouth every 6 (six) hours as needed for moderate pain or severe pain. (Patient not taking: Reported on 05/25/2014) 6 tablet 0  . predniSONE (DELTASONE) 10 MG tablet 6,5,4,3,2,1 taper (Patient not taking: Reported on 03/05/2014) 21 tablet 0    Musculoskeletal: Strength & Muscle Tone: within normal limits Gait & Station: normal Patient leans: N/A  Psychiatric Specialty Exam:     Blood pressure 132/93, pulse 84, temperature 98 F (36.7  C), temperature source Oral, resp. rate 16, SpO2 99 %.There is no weight on file to calculate BMI.  General Appearance: Casual and Fairly Groomed  Eye Contact::  Good  Speech:  Clear and Coherent and Normal Rate  Volume:  Normal  Mood:  Anxious and Depressed  Affect:  Congruent, Depressed and Flat  Thought Process:  Coherent, Goal Directed and Intact  Orientation:  Full (Time, Place, and Person)  Thought Content:  WDL  Suicidal Thoughts:  No  Homicidal Thoughts:  Yes.  without intent/plan  Memory:  Immediate;    Good Recent;   Good Remote;   Good  Judgement:  Fair  Insight:  Fair  Psychomotor Activity:  Normal  Concentration:  Good  Recall:  NA  Fund of Knowledge:Good  Language: Good  Akathisia:  NA  Handed:  Right  AIMS (if indicated):     Assets:  Desire for Improvement  ADL's:  Intact  Cognition: WNL  Sleep:      Medical Decision Making: Review of Psycho-Social Stressors (1), Established Problem, Worsening (2), Review of Medication Regimen & Side Effects (2) and Review of New Medication or Change in Dosage (2)  Treatment Plan Summary: Daily contact with patient to assess and evaluate symptoms and progress in treatment, Medication management and Plan Admit and look for available bed  Plan:  Recommend psychiatric Inpatient admission when medically cleared. Disposition: see above  Delfin Gant    PMHNP-BC 06/02/2014 3:17 PM Patient seen face-to-face for psychiatric evaluation, chart reviewed and case discussed with the physician extender and developed treatment plan. Reviewed the information documented and agree with the treatment plan. Corena Pilgrim, MD

## 2014-06-02 NOTE — ED Notes (Signed)
Patient standing in hallway.  Wants to speak to the provider again about his medications.  Patient thought he received his pain medication every 4 hours instead of every 8.  He has been cooperative and pleasant.

## 2014-06-03 DIAGNOSIS — F332 Major depressive disorder, recurrent severe without psychotic features: Secondary | ICD-10-CM

## 2014-06-03 MED ORDER — DULOXETINE HCL 30 MG PO CPEP: 30.0000 mg | ORAL_CAPSULE | Freq: Every day | ORAL | Status: AC

## 2014-06-03 MED ORDER — MIRTAZAPINE 15 MG PO TABS: 15.0000 mg | ORAL_TABLET | Freq: Every day | ORAL | Status: AC

## 2014-06-03 MED ORDER — PANTOPRAZOLE SODIUM 40 MG PO TBEC: 40.0000 mg | DELAYED_RELEASE_TABLET | Freq: Every day | ORAL | Status: AC

## 2014-06-03 NOTE — [facility] Counselor (Signed)
TTS counselor sent referrals to the additional facilities in effort to obtain inpt placement:  [facility]

## 2014-06-03 NOTE — ED Notes (Signed)
Patient complained of increase anxiety and chronic back pain. NAD. Patient medicated per MAR.  Respirations equal and unlabored, skin warm and dry.  No change in acuity. Q 15 minute safety checks remain in place.

## 2014-06-03 NOTE — ED Notes (Signed)
Written dc instructions and Rx3 reviewed w/ patient, pt reports that he has his BP medication, and is requesting a prescrition for the pain medication.  No additional pain rx  For pain med per NP.  Pt encouraged to follow with his psychiatrist this week and continue to [place].  Pt encouraged to seek treatment/return for suicidal/homicidal thoughts  or urges and AVH.  Pt verbalized understanding.  Pt ambulatory w/o difficulty to dc window w/ RN, belongings returned after leaving the area.

## 2014-06-03 NOTE — [facility] Suicide Risk Assessment (Cosign Needed)
Suicide Risk Assessment  Discharge Assessment   [facility]   Demographic Factors:  Male, Living alone and Unemployed  Total Time spent with patient: 20 minutes  Musculoskeletal: Strength & Muscle Tone: within normal limits Gait & Station: normal Patient leans: N/A  Psychiatric Specialty Exam:     Blood pressure 129/79, pulse 96, temperature 98.2 F (36.8 C), temperature source Oral, resp. rate 16, SpO2 100 %.There is no weight on file to calculate BMI.  General Appearance: Casual and Fairly Groomed  Eye Contact::  Good  Speech:  Clear and Coherent and Normal Rate409  Volume:  Normal  Mood:  Depressed  Affect:  Congruent  Thought Process:  Coherent, Goal Directed and Intact  Orientation:  Full (Time, Place, and Person)  Thought Content:  WDL  Suicidal Thoughts:  No  Homicidal Thoughts:  No  Memory:  Immediate;   Good Recent;   Good Remote;   Good  Judgement:  Fair  Insight:  Fair  Psychomotor Activity:  Normal  Concentration:  Good  Recall:  NA  Fund of Knowledge:Good  Language: Good  Akathisia:  NA  Handed:  Right  AIMS (if indicated):     Assets:  Desire for Improvement  Sleep:     Cognition: WNL  ADL's:  Intact      Has this patient used any form of tobacco in the last 30 days? (Cigarettes, Smokeless Tobacco, Cigars, and/or Pipes) N/A  Mental Status Per Nursing Assessment::   On Admission:     Current Mental Status by Physician: NA  Loss Factors: NA  Historical Factors: NA  Risk Reduction Factors:   Positive social support  Continued Clinical Symptoms:  Depression:   Insomnia  Cognitive Features That Contribute To Risk:  Polarized thinking    Suicide Risk:  Minimal: No identifiable suicidal ideation.  Patients presenting with no risk factors but with morbid ruminations; may be classified as minimal risk based on the severity of the depressive symptoms  Principal Problem: Major depressive disorder, recurrent  severe without psychotic features Discharge Diagnoses:  Patient Active Problem List   Diagnosis Date Noted  . Major depressive disorder, recurrent severe without psychotic features [F33.2] 06/02/2014    Priority: High      Plan Of Care/Follow-up recommendations:  Activity:  As tolerated Diet:  Regular  Is patient on multiple antipsychotic therapies at discharge:  No   Has Patient had three or more failed trials of antipsychotic monotherapy by history:  No  Recommended Plan for Multiple Antipsychotic Therapies: NA    Mollie Rossano C    PMHNP-BC 06/03/2014, 10:51 AM

## 2014-06-03 NOTE — Consult Note (Signed)
[facility] Consult   Reason for Consult: RECURRENT MAJOR DEPRESSION,  Referring Physician:  EDP Patient Identification: Garrett Cole MRN:  [id number] Principal Diagnosis: Major depressive disorder, recurrent severe without psychotic features Diagnosis:   Patient Active Problem List   Diagnosis Date Noted  . Major depressive disorder, recurrent severe without psychotic features [F33.2] 06/02/2014    Priority: High    Total Time spent with patient: 1 hour  Subjective:   Garrett Cole is a 44 y.o. male patient admitted with Major depression, Chronic back pain.  HPI: AA male, 44 years old was evaluated for increased depression secondary to severe chronic pain.  Patient is a veteran who sees a Teacher, music at the [place] [facility].  Patient reported that his depression has increased due to pain to his back and that his PMD at the [facility] clinic has stopped seeing him.  Patient stated that he is in such a severe pain that he feels homicidal towards his [place] doctor.  Patient denies SI/AVH but stated that he occasionally want to hurt his [place] doctor for refusing to see him and prescribe his pain medications.  He denies previous suicidal or homicidal attempts.   He reports feeling hopeless and helpless. Patient also stated that he does not want to hurt his provider but want his pain taken care of.  He reports previous hospitalization at [facility]  Patient stated that he is tired of being in pain.   He has been accepted for admission and will be transferred to any unit with available inpatient Psychiatric bed.  We have resumed his home medications.  06/03/14:  On rounds today patient denies SI/HI/AVH.  He is worried about his pain control and esophageal stricture.  He is aware of the fact that he need to see his PMD for his Physical and medical problems.  Patient verbalized understanding and is being discharged.  He agrees to contact his [place] for assistance.  HPI Elements:    Location:  Recurrent major depression, Chronic PTSD by hx, Homicidal ideation. Quality:  severe, chronic back pain treatment. Severity:  severe. Timing:  Acute. Duration:  Chronic mental illness. Context:  Seeking treatment for depression and chronic pain..  Past Medical History:  Past Medical History  Diagnosis Date  . 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 Hypertension   . Chronic back pain   . Lumbar radiculopathy     Past Surgical History  Procedure Laterality Date  . Leg surgery     Family History: History reviewed. No pertinent family history. Social History:  History  Alcohol Use No     History  Drug Use No    History   Social History  . Marital Status: Single    Spouse Name: N/A  . Number of Children: N/A  . Years of Education: N/A   Social History Main Topics  . Smoking status: Never Smoker   . Smokeless tobacco: Not on file  . Alcohol Use: No  . Drug Use: No  . Sexual Activity: Not on file   Other Topics Concern  . None   Social History Narrative   Additional Social History:    Prescriptions: See PTA list History of alcohol / drug use?: No history of alcohol / drug abuse (pt denies)     Allergies:   Allergies  Allergen Reactions  . Hydrocodone Nausea And Vomiting  . Ibuprofen Nausea And Vomiting  . Iodine Itching and Rash  Labs:  Results for orders placed or performed during the hospital encounter of 06/01/14 (from the past 48 hour(s))  Urine rapid drug screen (hosp performed)     Status: None   Collection Time: 06/01/14  6:12 PM  Result Value Ref Range   Opiates NONE DETECTED NONE DETECTED   Cocaine NONE DETECTED NONE DETECTED   Benzodiazepines NONE DETECTED NONE DETECTED   Amphetamines NONE DETECTED NONE DETECTED   Tetrahydrocannabinol NONE DETECTED NONE DETECTED   Barbiturates NONE DETECTED NONE DETECTED    Comment:        DRUG SCREEN FOR MEDICAL PURPOSES ONLY.  IF CONFIRMATION IS NEEDED FOR ANY  PURPOSE, NOTIFY LAB WITHIN 5 DAYS.        LOWEST DETECTABLE LIMITS FOR URINE DRUG SCREEN Drug Class       Cutoff (ng/mL) Amphetamine      1000 Barbiturate      200 Benzodiazepine   034 Tricyclics       742 Opiates          300 Cocaine          300 THC              50   Ethanol (ETOH)     Status: None   Collection Time: 06/01/14  6:38 PM  Result Value Ref Range   Alcohol, Ethyl (B) <5 0 - 9 mg/dL    Comment:        LOWEST DETECTABLE LIMIT FOR SERUM ALCOHOL IS 11 mg/dL FOR MEDICAL PURPOSES ONLY   CBC     Status: Abnormal   Collection Time: 06/01/14  6:40 PM  Result Value Ref Range   WBC 9.3 4.0 - 10.5 K/uL   RBC 4.27 4.22 - 5.81 MIL/uL   Hemoglobin 12.2 (L) 13.0 - 17.0 g/dL   HCT 38.1 (L) 39.0 - 52.0 %   MCV 89.2 78.0 - 100.0 fL   MCH 28.6 26.0 - 34.0 pg   MCHC 32.0 30.0 - 36.0 g/dL   RDW 14.6 11.5 - 15.5 %   Platelets 343 150 - 400 K/uL  Comprehensive metabolic panel     Status: Abnormal   Collection Time: 06/01/14  6:40 PM  Result Value Ref Range   Sodium 140 135 - 145 mmol/L   Potassium 3.5 3.5 - 5.1 mmol/L   Chloride 108 96 - 112 mmol/L   CO2 23 19 - 32 mmol/L   Glucose, Bld 112 (H) 70 - 99 mg/dL   BUN 12 6 - 23 mg/dL   Creatinine, Ser 1.35 0.50 - 1.35 mg/dL   Calcium 9.1 8.4 - 10.5 mg/dL   Total Protein 6.7 6.0 - 8.3 g/dL   Albumin 4.0 3.5 - 5.2 g/dL   AST 21 0 - 37 U/L   ALT 11 0 - 53 U/L   Alkaline Phosphatase 81 39 - 117 U/L   Total Bilirubin 0.6 0.3 - 1.2 mg/dL   GFR calc non Af Amer 62 (L) >90 mL/min   GFR calc Af Amer 72 (L) >90 mL/min    Comment: (NOTE) The eGFR has been calculated using the CKD EPI equation. This calculation has not been validated in all clinical situations. eGFR's persistently <90 mL/min signify possible Chronic Kidney Disease.    Anion gap 9 5 - 15    Vitals: Blood pressure 148/94, pulse 89, temperature 98.2 F (36.8 C), temperature source Oral, resp. rate 16, SpO2 100 %.  Risk to Self: Suicidal Ideation: Yes-Currently  Present Suicidal Intent: Yes-Currently Present Is  patient at risk for suicide?: Yes Suicidal Plan?: Yes-Currently Present Specify Current Suicidal Plan: no specific plan but many in his mind he says Access to Means: No What has been your use of drugs/alcohol within the last 12 months?: none How many times?: 0 Other Self Harm Risks: none per pt Triggers for Past Attempts: Unpredictable Intentional Self Injurious Behavior: None Risk to Others: Homicidal Ideation: No Thoughts of Harm to Others: Yes-Currently Present Comment - Thoughts of Harm to Others: Pt said he he has lots of thoughts of haming others (no plans and no one in particular per pt) Current Homicidal Intent: No (denies) Current Homicidal Plan: No Access to Homicidal Means: No (denies) Identified Victim: no History of harm to others?: No (denies) Assessment of Violence: None Noted (denies) Violent Behavior Description: na Does patient have access to weapons?: No (denies) Criminal Charges Pending?: No Does patient have a court date: No Prior Inpatient Therapy: Prior Inpatient Therapy: Yes Prior Therapy Dates: various [facility] Reason for Treatment: Depression, PTSD, SI Prior Outpatient Therapy: Prior Outpatient Therapy: No Prior Therapy Dates: na Prior Therapy Facilty/Provider(s): na Reason for Treatment: na  Current Facility-Administered Medications  Medication Dose Route Frequency Provider Last Rate Last Dose  . acetaminophen (TYLENOL) tablet 650 mg  650 mg Oral Q4H PRN Davonna Belling, MD   650 mg at 06/03/14 0759  . amLODipine (NORVASC) tablet 10 mg  10 mg Oral Daily Tanna Furry, MD   10 mg at 06/03/14 1002  . DULoxetine (CYMBALTA) DR capsule 30 mg  30 mg Oral Daily Mojeed Akintayo   30 mg at 06/03/14 1003  . LORazepam (ATIVAN) tablet 1 mg  1 mg Oral Q8H PRN Davonna Belling, MD   1 mg at 06/03/14 0258  . mirtazapine (REMERON) tablet 15 mg  15 mg Oral QHS Mojeed Akintayo   15 mg  at 06/02/14 2133  . ondansetron (ZOFRAN) tablet 4 mg  4 mg Oral Q8H PRN Davonna Belling, MD      . oxyCODONE (Oxy IR/ROXICODONE) immediate release tablet 5 mg  5 mg Oral Q8H PRN Davonna Belling, MD   5 mg at 06/03/14 0258  . pantoprazole (PROTONIX) EC tablet 40 mg  40 mg Oral Daily Tanna Furry, MD   40 mg at 06/03/14 1003   Current Outpatient Prescriptions  Medication Sig Dispense Refill  . ALPRAZolam (XANAX) 1 MG tablet Take 1 mg by mouth 3 (three) times daily as needed for anxiety.    Marland Kitchen amLODipine (NORVASC) 10 MG tablet Take 10 mg by mouth daily.    Marland Kitchen buPROPion (WELLBUTRIN XL) 150 MG 24 hr tablet Take 150 mg by mouth daily.    . cyclobenzaprine (FLEXERIL) 10 MG tablet Take 10 mg by mouth 2 (two) times daily as needed for muscle spasms.    . DULoxetine (CYMBALTA) 60 MG capsule Take 60 mg by mouth 2 (two) times daily.    Marland Kitchen lidocaine (LIDODERM) 5 % Place 1 patch onto the skin daily as needed (for pain). Remove & Discard patch within 12 hours or as directed by MD    . mirtazapine (REMERON) 30 MG tablet Take 30 mg by mouth at bedtime.    Marland Kitchen omeprazole (PRILOSEC) 20 MG capsule Take 20 mg by mouth daily.    Marland Kitchen oxyCODONE (OXY IR/ROXICODONE) 5 MG immediate release tablet Take 5 mg by mouth 3 (three) times daily.    . polyethylene glycol (MIRALAX / GLYCOLAX) packet Take 17 g by mouth daily as needed for mild constipation.    Marland Kitchen  sildenafil (VIAGRA) 100 MG tablet Take 100 mg by mouth daily as needed for erectile dysfunction.    Marland Kitchen oxyCODONE-acetaminophen (PERCOCET/ROXICET) 5-325 MG per tablet Take 1 tablet by mouth every 6 (six) hours as needed for moderate pain or severe pain. (Patient not taking: Reported on 05/25/2014) 6 tablet 0  . predniSONE (DELTASONE) 10 MG tablet 6,5,4,3,2,1 taper (Patient not taking: Reported on 03/05/2014) 21 tablet 0    Musculoskeletal: Strength & Muscle Tone: within normal limits Gait & Station: normal Patient leans: N/A  Psychiatric Specialty Exam:     Blood pressure  148/94, pulse 89, temperature 98.2 F (36.8 C), temperature source Oral, resp. rate 16, SpO2 100 %.There is no weight on file to calculate BMI.  General Appearance: Casual and Fairly Groomed  Eye Contact::  Good  Speech:  Clear and Coherent and Normal Rate  Volume:  Normal  Mood:  Anxious and Depressed  Affect:  Congruent, Depressed and Flat  Thought Process:  Coherent, Goal Directed and Intact  Orientation:  Full (Time, Place, and Person)  Thought Content:  WDL  Suicidal Thoughts:  No  Homicidal Thoughts:  No  Memory:  Immediate;   Good Recent;   Good Remote;   Good  Judgement:  Fair  Insight:  Fair  Psychomotor Activity:  Normal  Concentration:  Good  Recall:  NA  Fund of Knowledge:Good  Language: Good  Akathisia:  NA  Handed:  Right  AIMS (if indicated):     Assets:  Desire for Improvement  ADL's:  Intact  Cognition: WNL  Sleep:      Medical Decision Making: Established Problem, Stable/Improving (1)  Treatment Plan Summary: Discharge home  Plan:  No evidence of imminent risk to self or others at present.   Discharge home Disposition: Discharge home  Delfin Gant    PMHNP-BC 06/03/2014 10:35 AM

## 2014-06-03 NOTE — ED Notes (Signed)
Dr Ladona Ridgeltaylor and Julieanne CottonJosephine into see

## 2014-06-03 NOTE — ED Notes (Addendum)
Up to the bathroom, ambulatory w/o difficulty 

## 2014-06-03 NOTE — ED Notes (Signed)
Patient resting quietly in bed with eyes closed, Respirations equal and unlabored, skin warm and dry, NAD. No change in assessment or acuity. Q 15 minute safety checks remain in place.   

## 2016-07-15 IMAGING — CR DG LUMBAR SPINE COMPLETE 4+V
5 series · 5 of 5 positions shown · non-contrast
Comparison: Most recent MRI 04/19/2014. Prior plain films
09/23/2009.

CLINICAL DATA: Fell in shower today. Initial encounter. Back pain.

EXAM:
LUMBAR SPINE - COMPLETE 4+ VIEW

[t lumbar spine ap]
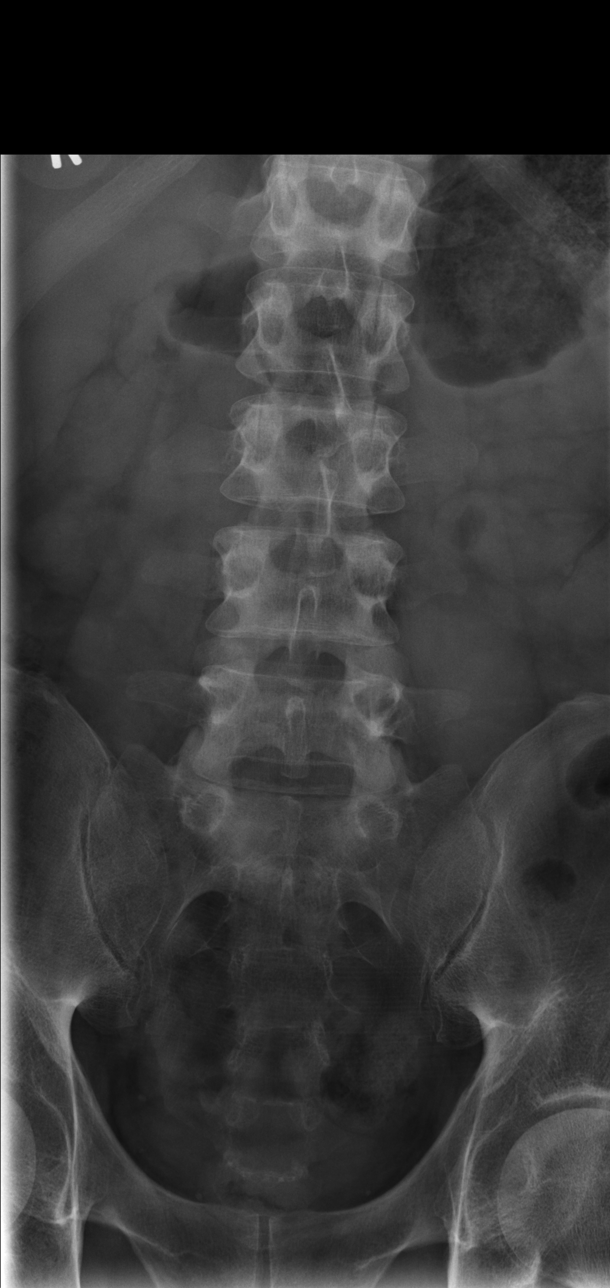

[t lumbar spine obl (1 of 2)]
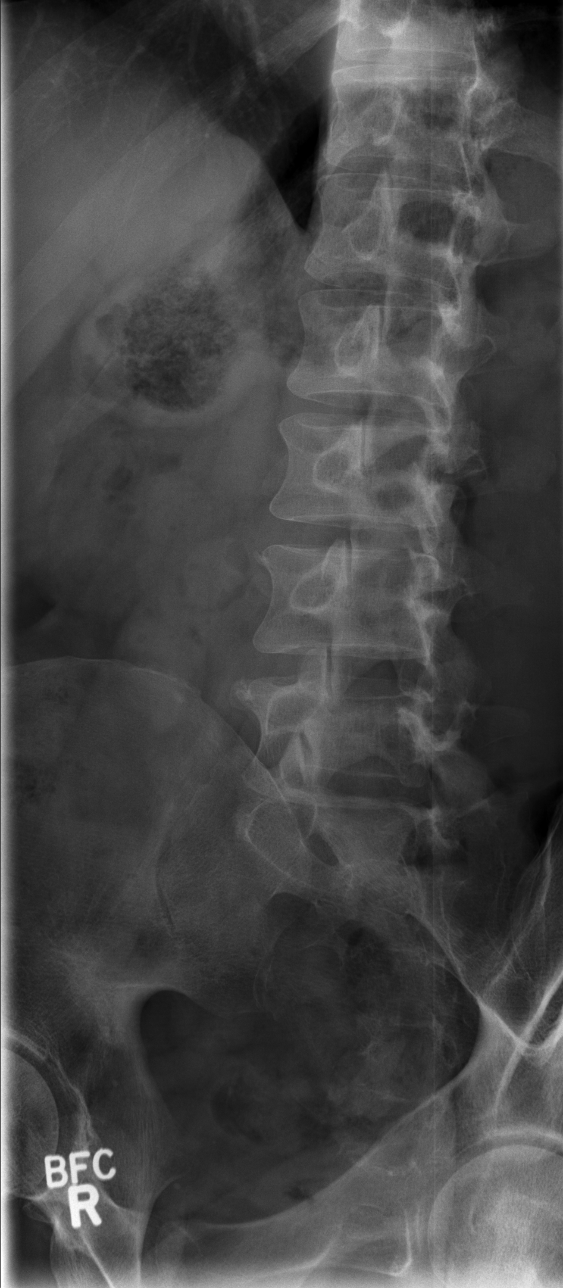

[t lumbar spine obl (2 of 2)]
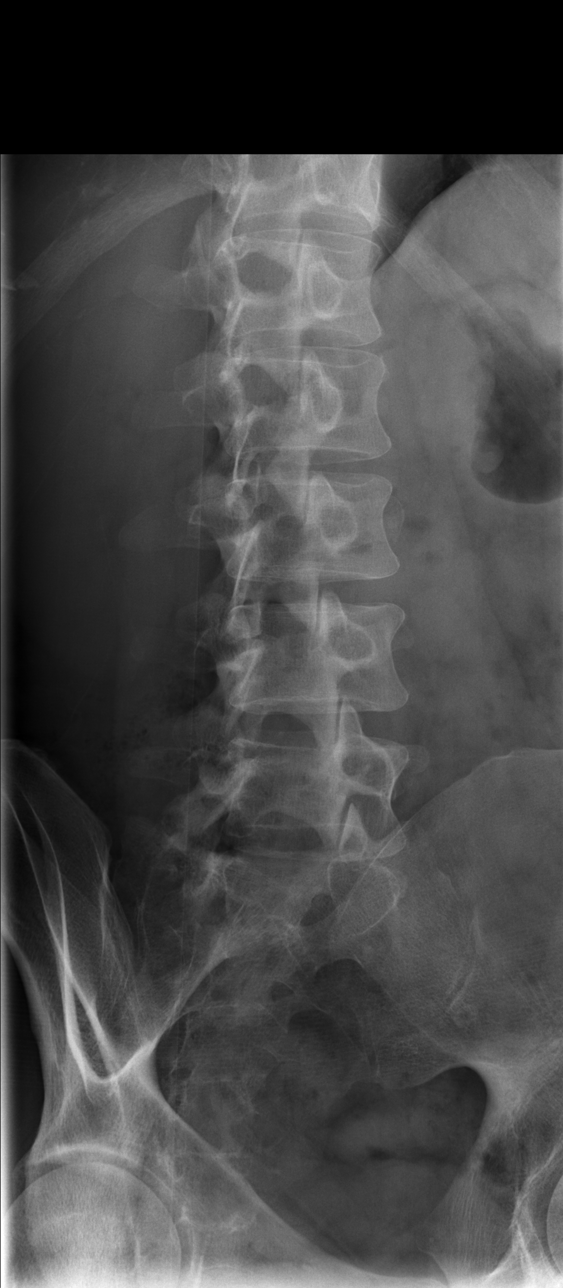

[t lumbar spine lat]
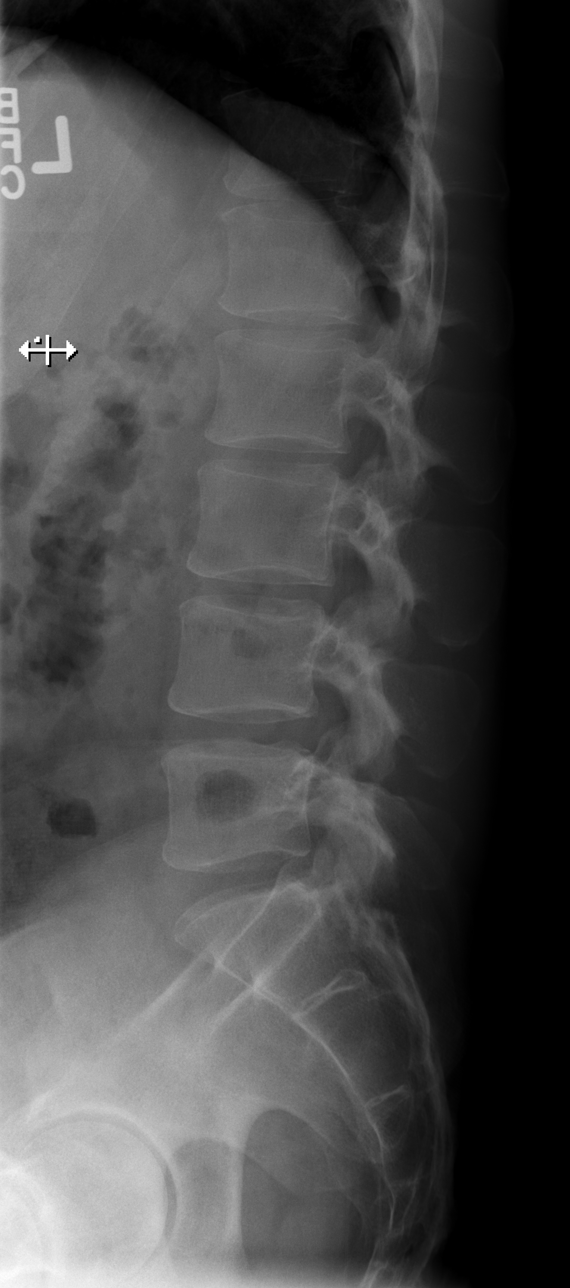

[t lumbar l-5 s-1 spot]
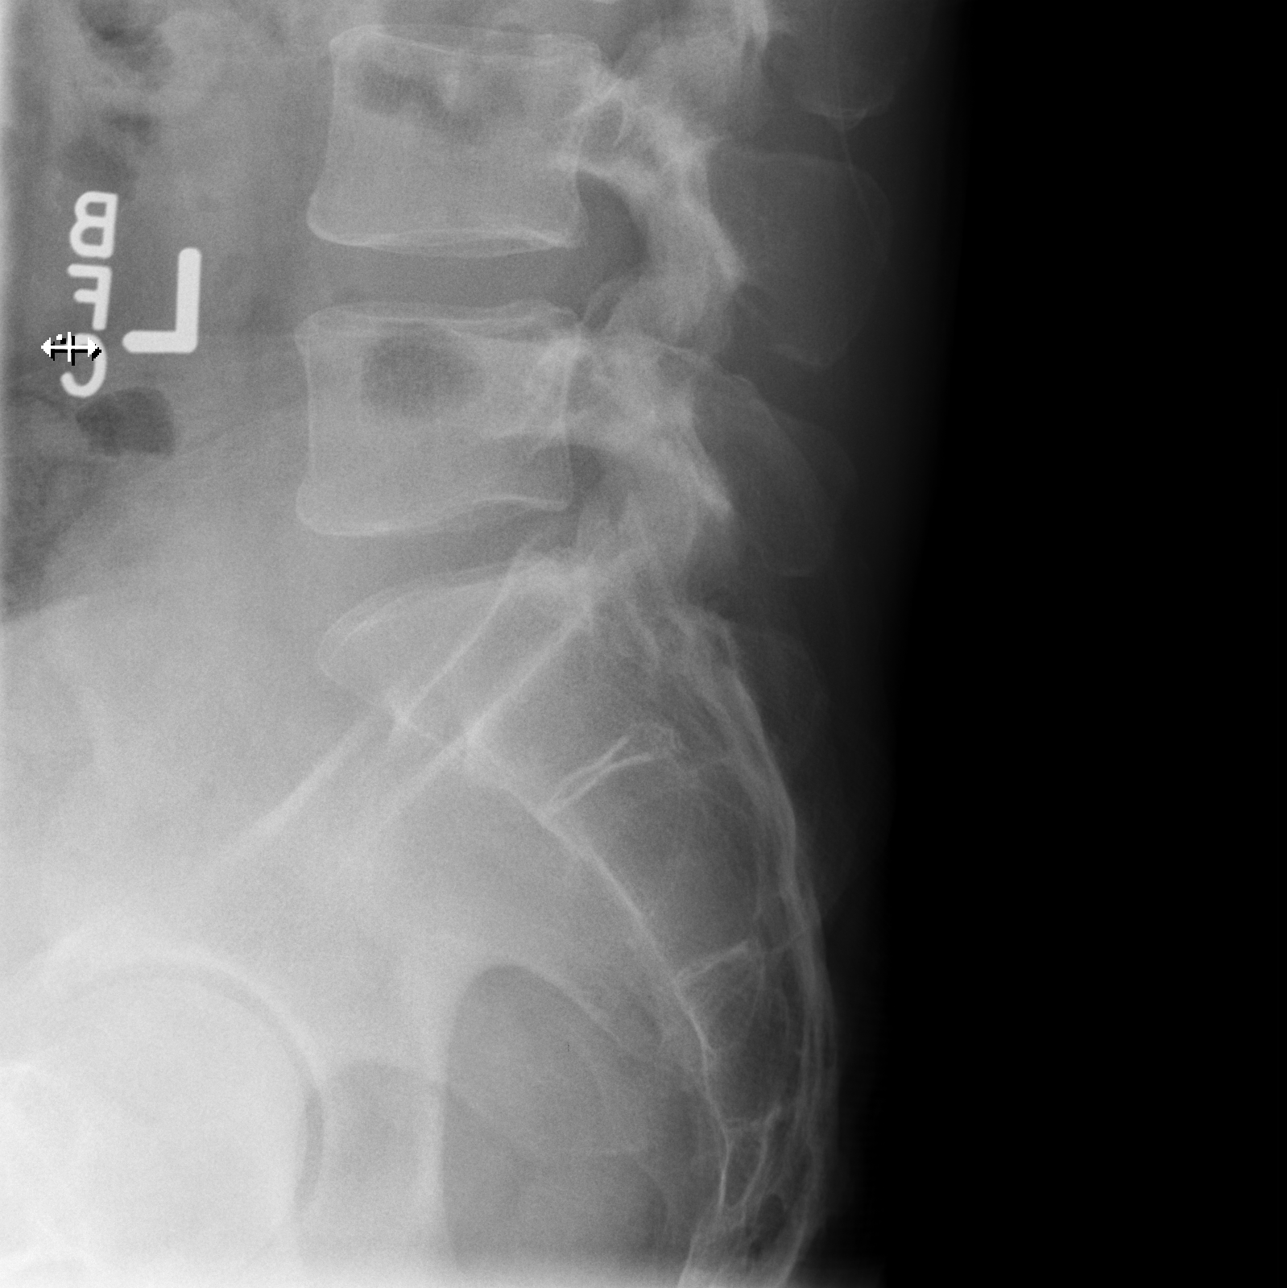

[5 of 5 positions shown; findings below may reference images not displayed]

FINDINGS: There are apparent lucencies overlying the L4 and L5 vertebral
bodies which are believed to represent bowel gas.

No compression fracture or traumatic subluxation. No significant
facet disease or pars defects. Intervertebral disc spaces are
preserved. No pelvic lesions.

Similar appearance to prior lumbar spine plain films and prior MR.

No clear-cut progression of the known lesion in the L5 vertebral
body compared with prior imaging.
IMPRESSION: No definite posttraumatic sequelae.  See discussion above

## 2016-07-15 IMAGING — CR DG SACRUM/COCCYX 2+V
4 series · 4 of 4 positions shown · non-contrast
Comparison: None.

CLINICAL DATA: Low back pain.

EXAM:
SACRUM AND COCCYX - 2+ VIEW

[t sacrum ap]
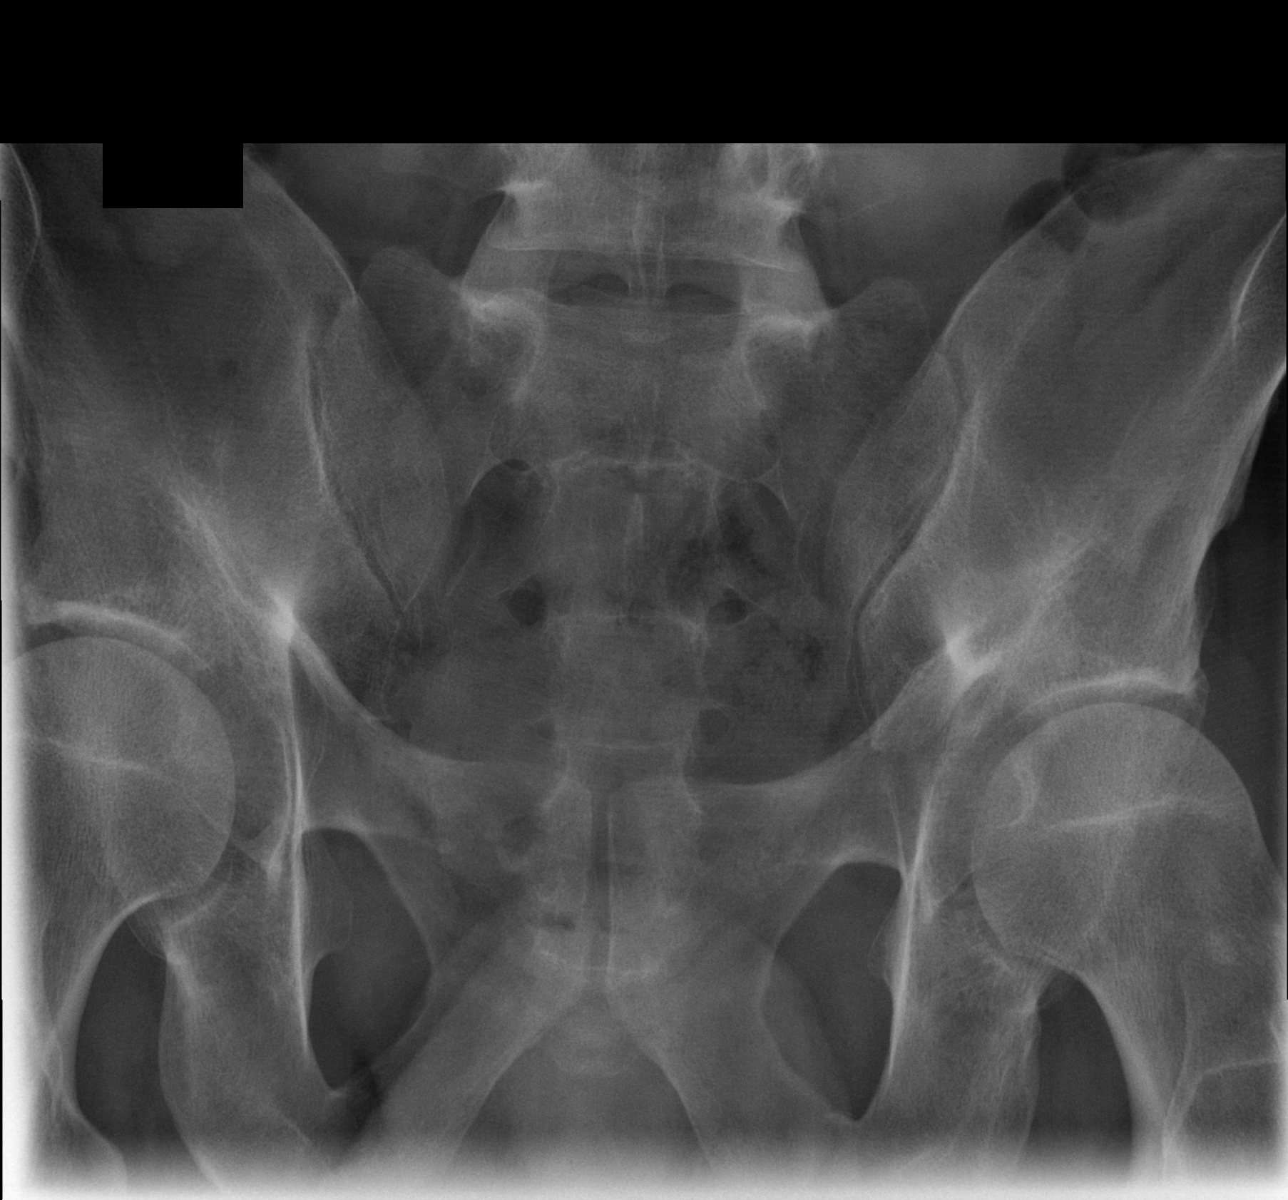

[t coccyx ap (1 of 2)]
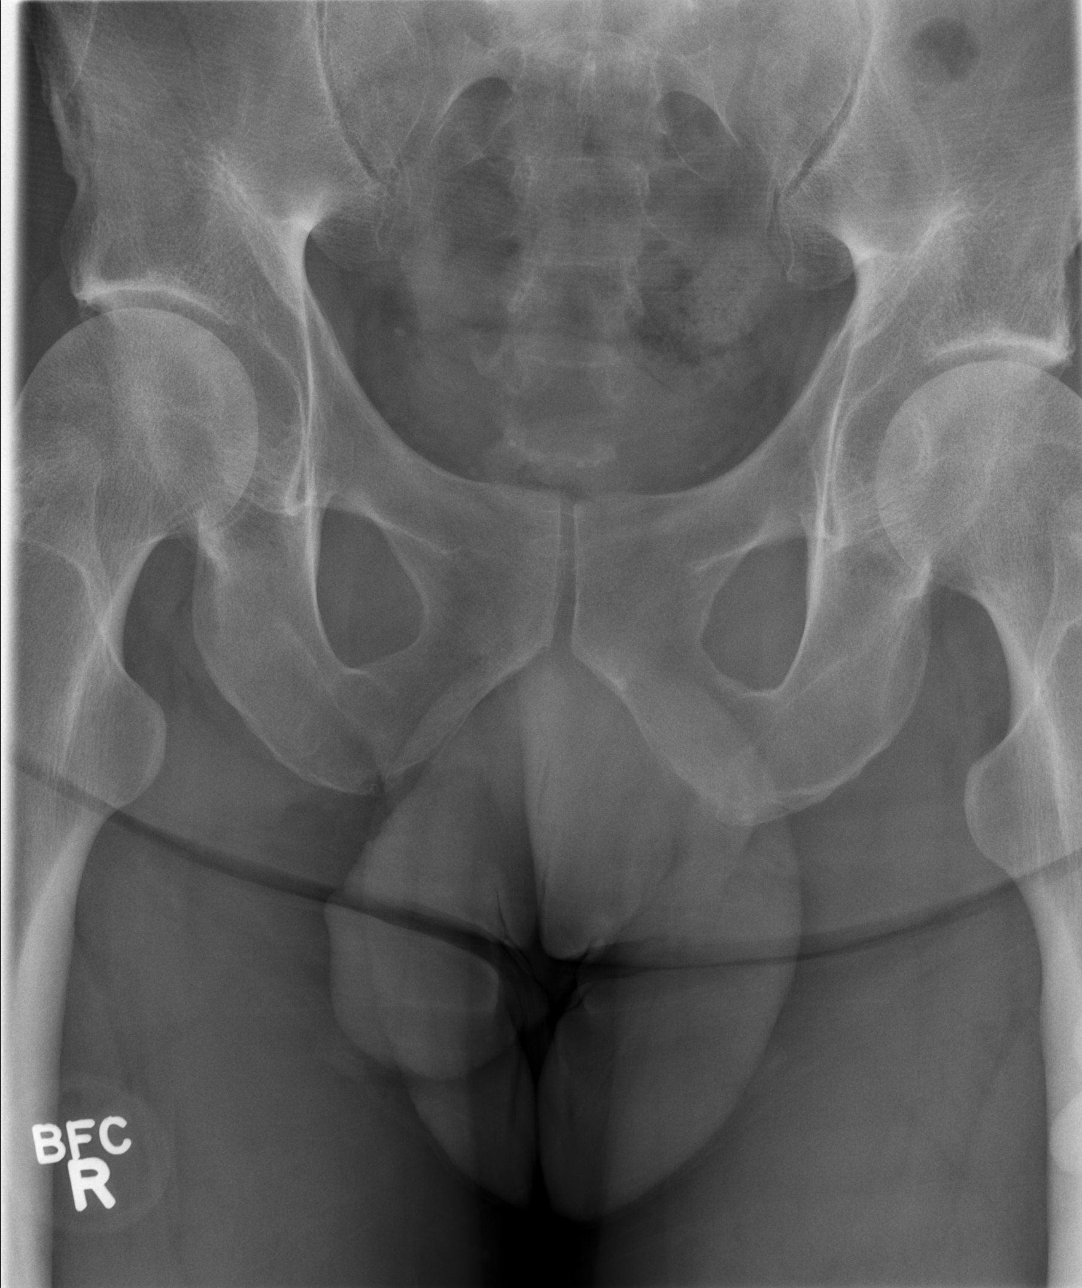

[t coccyx ap (2 of 2)]
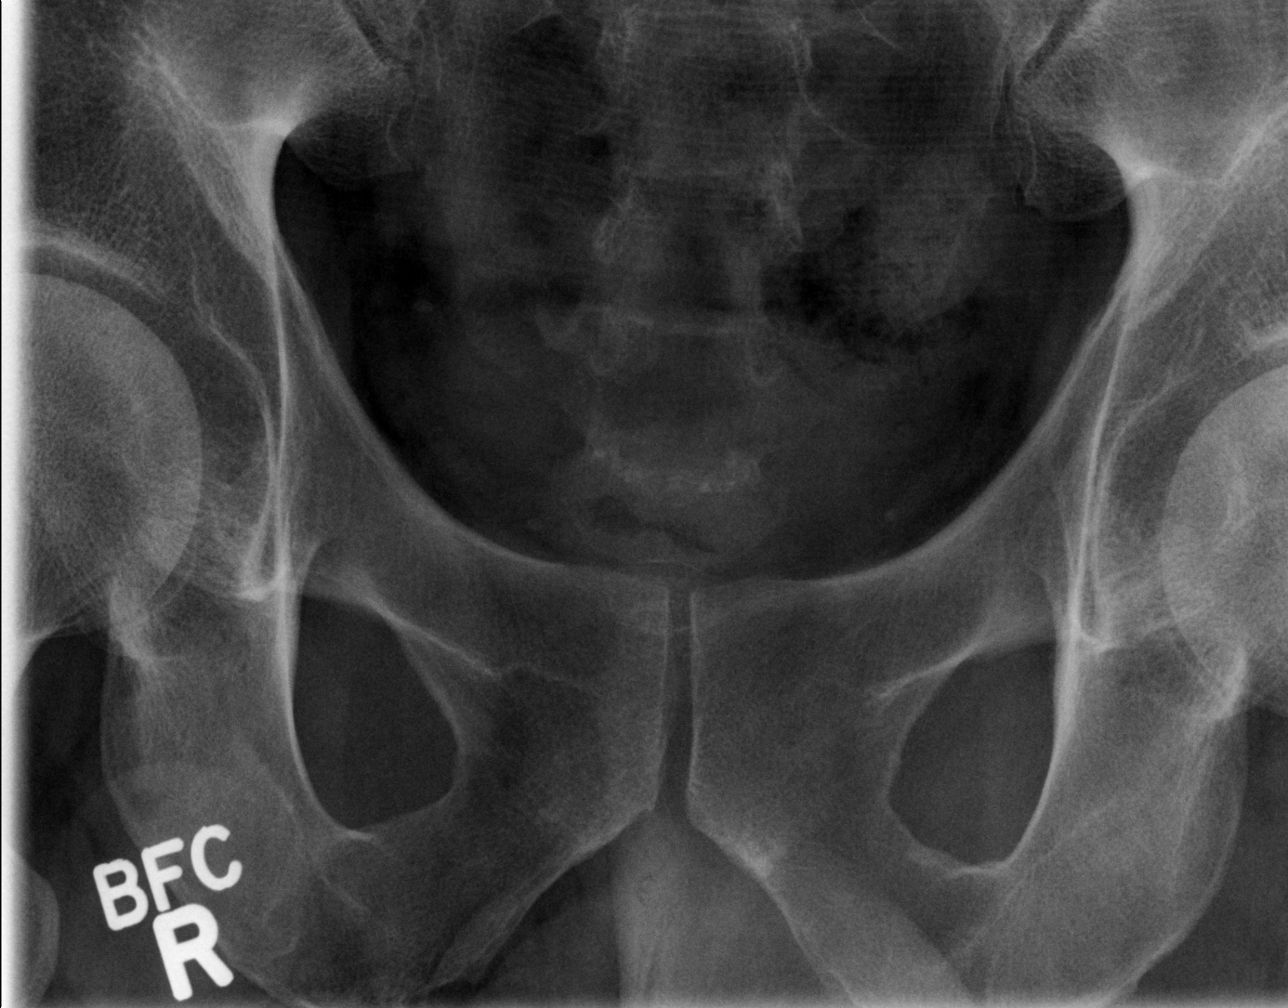

[t sacrum coccyx lat]
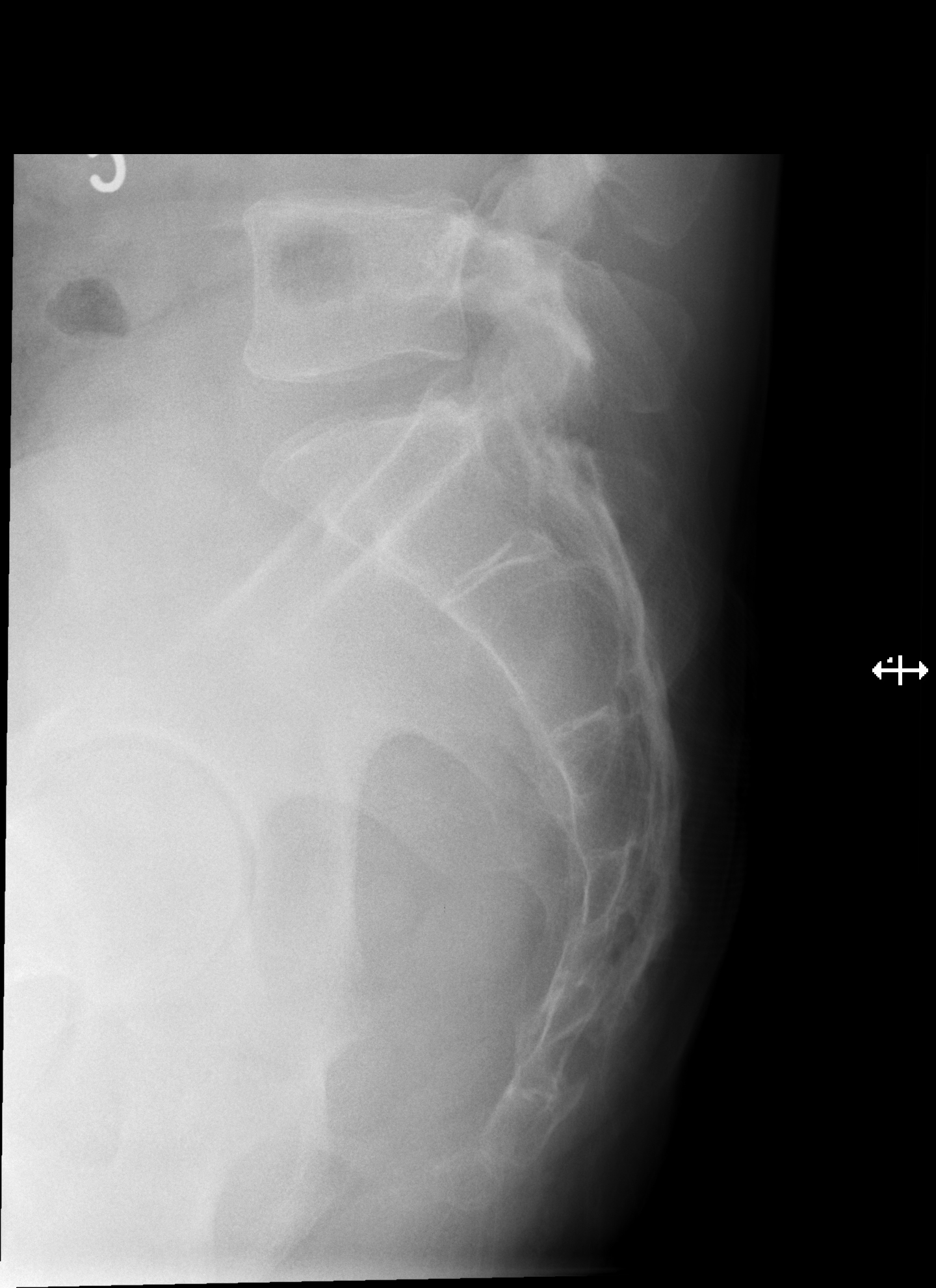

[4 of 4 positions shown; findings below may reference images not displayed]

FINDINGS: There is no evidence of fracture or other focal bone lesions.
IMPRESSION: Negative.
